# Patient Record
Sex: Male | Born: 1975 | Race: White | Hispanic: No | Marital: Married | Smoking: Current every day smoker
Health system: Southern US, Community
[De-identification: ages and names within clinical notes are randomized; demographics above are authoritative.]

## PROBLEM LIST (undated history)

## (undated) DIAGNOSIS — N200 Calculus of kidney: Secondary | ICD-10-CM

## (undated) DIAGNOSIS — F909 Attention-deficit hyperactivity disorder, unspecified type: Secondary | ICD-10-CM

## (undated) DIAGNOSIS — F32A Depression, unspecified: Secondary | ICD-10-CM

## (undated) DIAGNOSIS — F419 Anxiety disorder, unspecified: Secondary | ICD-10-CM

## (undated) DIAGNOSIS — K219 Gastro-esophageal reflux disease without esophagitis: Secondary | ICD-10-CM

## (undated) DIAGNOSIS — F329 Major depressive disorder, single episode, unspecified: Secondary | ICD-10-CM

## (undated) HISTORY — DX: Major depressive disorder, single episode, unspecified: F32.9

## (undated) HISTORY — PX: VASECTOMY: SHX75

## (undated) HISTORY — DX: Depression, unspecified: F32.A

## (undated) HISTORY — DX: Calculus of kidney: N20.0

## (undated) HISTORY — DX: Anxiety disorder, unspecified: F41.9

## (undated) HISTORY — DX: Attention-deficit hyperactivity disorder, unspecified type: F90.9

---

## 2003-01-11 ENCOUNTER — Emergency Department (HOSPITAL_COMMUNITY): Admission: EM | Admit: 2003-01-11 | Discharge: 2003-01-11 | Payer: Self-pay | Admitting: Emergency Medicine

## 2003-05-29 ENCOUNTER — Emergency Department (HOSPITAL_COMMUNITY): Admission: EM | Admit: 2003-05-29 | Discharge: 2003-05-30 | Payer: Self-pay | Admitting: Emergency Medicine

## 2003-05-30 ENCOUNTER — Encounter: Payer: Self-pay | Admitting: Emergency Medicine

## 2005-09-21 ENCOUNTER — Emergency Department (HOSPITAL_COMMUNITY): Admission: EM | Admit: 2005-09-21 | Discharge: 2005-09-21 | Payer: Self-pay | Admitting: Emergency Medicine

## 2006-03-10 ENCOUNTER — Emergency Department (HOSPITAL_COMMUNITY): Admission: EM | Admit: 2006-03-10 | Discharge: 2006-03-10 | Payer: Self-pay | Admitting: Emergency Medicine

## 2006-10-15 ENCOUNTER — Emergency Department (HOSPITAL_COMMUNITY): Admission: EM | Admit: 2006-10-15 | Discharge: 2006-10-16 | Payer: Self-pay | Admitting: Emergency Medicine

## 2008-08-03 ENCOUNTER — Ambulatory Visit: Payer: Self-pay | Admitting: Family Medicine

## 2008-08-03 DIAGNOSIS — Z87442 Personal history of urinary calculi: Secondary | ICD-10-CM | POA: Insufficient documentation

## 2008-08-03 DIAGNOSIS — F329 Major depressive disorder, single episode, unspecified: Secondary | ICD-10-CM

## 2008-08-07 LAB — CONVERTED CEMR LAB
ALT: 30 units/L (ref 0–53)
AST: 26 units/L (ref 0–37)
Basophils Absolute: 0 10*3/uL (ref 0.0–0.1)
Basophils Relative: 0.2 % (ref 0.0–3.0)
Bilirubin, Direct: 0.1 mg/dL (ref 0.0–0.3)
CO2: 30 meq/L (ref 19–32)
Creatinine, Ser: 0.8 mg/dL (ref 0.4–1.5)
GFR calc Af Amer: 146 mL/min
HDL: 31.3 mg/dL — ABNORMAL LOW (ref >39.0)
Lymphocytes Relative: 29 % (ref 12.0–46.0)
MCV: 95.7 fL (ref 78.0–100.0)
Monocytes Relative: 11.7 % (ref 3.0–12.0)
Neutro Abs: 4.5 10*3/uL (ref 1.4–7.7)
Neutrophils Relative %: 57.8 % (ref 43.0–77.0)
RDW: 13 % (ref 11.5–14.6)
Sodium: 140 meq/L (ref 135–145)
Total Bilirubin: 0.6 mg/dL (ref 0.3–1.2)
VLDL: 51 mg/dL — ABNORMAL HIGH (ref 0–40)

## 2008-08-08 ENCOUNTER — Telehealth: Payer: Self-pay | Admitting: Family Medicine

## 2008-08-14 ENCOUNTER — Telehealth: Payer: Self-pay | Admitting: Family Medicine

## 2008-08-24 ENCOUNTER — Ambulatory Visit: Payer: Self-pay | Admitting: Family Medicine

## 2008-08-30 ENCOUNTER — Encounter: Payer: Self-pay | Admitting: Family Medicine

## 2008-10-05 ENCOUNTER — Ambulatory Visit: Payer: Self-pay | Admitting: Family Medicine

## 2008-10-05 DIAGNOSIS — F411 Generalized anxiety disorder: Secondary | ICD-10-CM | POA: Insufficient documentation

## 2008-10-25 ENCOUNTER — Telehealth: Payer: Self-pay | Admitting: Family Medicine

## 2008-11-30 ENCOUNTER — Ambulatory Visit: Payer: Self-pay | Admitting: Family Medicine

## 2009-05-10 ENCOUNTER — Ambulatory Visit: Payer: Self-pay | Admitting: Family Medicine

## 2009-06-06 ENCOUNTER — Telehealth (INDEPENDENT_AMBULATORY_CARE_PROVIDER_SITE_OTHER): Payer: Self-pay | Admitting: *Deleted

## 2010-07-13 ENCOUNTER — Emergency Department (HOSPITAL_COMMUNITY): Admission: EM | Admit: 2010-07-13 | Discharge: 2010-07-13 | Payer: Self-pay | Admitting: Family Medicine

## 2010-11-26 LAB — POCT URINALYSIS DIPSTICK
Glucose, UA: NEGATIVE mg/dL
Ketones, ur: NEGATIVE mg/dL
Nitrite: NEGATIVE
Urobilinogen, UA: 0.2 mg/dL (ref 0.0–1.0)
pH: 5.5 (ref 5.0–8.0)

## 2010-12-11 ENCOUNTER — Encounter: Payer: Self-pay | Admitting: Family Medicine

## 2010-12-11 ENCOUNTER — Ambulatory Visit (INDEPENDENT_AMBULATORY_CARE_PROVIDER_SITE_OTHER): Payer: 59 | Admitting: Family Medicine

## 2010-12-11 VITALS — BP 110/70 | HR 108 | Temp 98.3°F

## 2010-12-11 DIAGNOSIS — L0390 Cellulitis, unspecified: Secondary | ICD-10-CM

## 2010-12-11 MED ORDER — DOXYCYCLINE HYCLATE 100 MG PO CAPS: 100.0000 mg | ORAL_CAPSULE | Freq: Two times a day (BID) | ORAL | Status: AC

## 2010-12-11 MED ORDER — CEFTRIAXONE SODIUM 1 G IJ SOLR
1.0000 g | Freq: Once | INTRAMUSCULAR | Status: AC
  Administered 2010-12-11: 1 g via INTRAMUSCULAR

## 2010-12-11 NOTE — Progress Notes (Signed)
  Subjective:    Patient ID: Jack Reid, male    DOB: 1976-03-19, 35 y.o.   MRN: [id number]  HPI Here for what he thinks is a spider bite on the back of the left had. He does not remember any trauma, but last night he began to have pain and redness and swelling in the hand. Today this has gotten worse. No fever or systemic complaints.    Review of Systems  Constitutional: Negative.   Respiratory: Negative.   Cardiovascular: Negative.   Skin: Positive for wound.       Objective:   Physical Exam  Constitutional: He appears well-developed and well-nourished.  Skin:       The dorsal and ulnar side of the left hand has 2 small pustules surrounded by a zone of erythema and swelling which is warm and quite tender. ROM of the fingers is full          Assessment & Plan:  These are probably spider bites that have gotten infected. Cover with Rocephin and Doxycycline. Use ice packs and Motrin.

## 2011-05-15 ENCOUNTER — Ambulatory Visit (INDEPENDENT_AMBULATORY_CARE_PROVIDER_SITE_OTHER): Payer: 59 | Admitting: Family Medicine

## 2011-05-15 ENCOUNTER — Encounter: Payer: Self-pay | Admitting: Family Medicine

## 2011-05-15 VITALS — BP 122/70 | HR 91 | Temp 98.5°F | Wt 149.0 lb

## 2011-05-15 DIAGNOSIS — M2140 Flat foot [pes planus] (acquired), unspecified foot: Secondary | ICD-10-CM

## 2011-05-15 MED ORDER — NABUMETONE 750 MG PO TABS: 750.0000 mg | ORAL_TABLET | Freq: Two times a day (BID) | ORAL | Status: DC

## 2011-05-15 NOTE — Progress Notes (Signed)
  Subjective:    Patient ID: Jack Reid, male    DOB: 12/27/1975, 35 y.o.   MRN: [id number]  HPI He has been flat fotted all his life, and now his feet cause him a lot of pain every day. His job entails walking on concrete all day, and he is required to wear steel toes boots. He has pain in the feet and even the lower legs. He has tried Motrin and Aleve, and he has tried gel arch supports, but these do not help. He knows molded arch supports are the best option, but his insurance will not cover them and he cannot afford them.    Review of Systems  Constitutional: Negative.        Objective:   Physical Exam  Constitutional: He appears well-developed and well-nourished.  Musculoskeletal:       Both arches are very flat. Otherwise feet look fine, no tenderness           Assessment & Plan:  Try Nabumetone to reduce inflammation.

## 2011-05-25 ENCOUNTER — Telehealth: Payer: Self-pay | Admitting: *Deleted

## 2011-05-25 DIAGNOSIS — M79673 Pain in unspecified foot: Secondary | ICD-10-CM

## 2011-05-25 NOTE — Telephone Encounter (Signed)
[facility] Triage Call Report Triage Record Num: [id number] Operator: Amy Head Patient Name: Jack Reid Call Date & Time: 05/22/2011 9:02:00PM Patient Phone: [phone] PCP: Tera Mater. Clent Ridges Patient Gender: Male PCP Fax : [phone] Patient DOB: 03-26-1976 [facility] Name[facility] Reason for Call: [name] calling and states that he was given Naeumetone 750MG  with instructions to take 1 tab PO BID for foot pain (pt states that he is extremely flat footed and was given this medication for foot pain until he can afford custom orthotics reccomended by MD). States that he has been taking 2 tabs at a time and wants to know if this is ok. Advised pt to take RX as directed by MD and not to increase dosage without MD approval. Protocol(s) Used: Medication Question Calls, No Triage (Adults) Recommended Outcome per Protocol: Call Provider within 24 Hours Reason for Outcome: Caller has non urgent medication question about med that PCP prescribed and triager unable to answer question Care Advice: ~ 05/22/2011 9:09:07PM Page 1 of 1 [id number]

## 2011-05-25 NOTE — Telephone Encounter (Signed)
Pt is asking to take a higher dose of Nabumetone for foot pain.

## 2011-05-26 NOTE — Telephone Encounter (Signed)
He is already on the highest dose of Nabumetone that there is. I cannot start him on narcotics for such a long term problem. As I told him during our visit, he needs to see a Podiatrist to find a long term solution for his foot pain

## 2011-05-26 NOTE — Telephone Encounter (Signed)
Spoke with pt and he does want a referral for the Podiatrist.

## 2011-05-26 NOTE — Telephone Encounter (Signed)
Pt callback concerning status of nabumetone for foot pain. Pt is aware wait on doc

## 2011-05-27 NOTE — Telephone Encounter (Signed)
Left voice message with below info. 

## 2011-05-27 NOTE — Telephone Encounter (Signed)
Referral was done. Let him know please

## 2011-07-31 ENCOUNTER — Telehealth: Payer: Self-pay | Admitting: Family Medicine

## 2011-07-31 NOTE — Telephone Encounter (Signed)
Pt would like cpx before end of yr. Can I work pt in?

## 2011-07-31 NOTE — Telephone Encounter (Signed)
Okay sometime in December

## 2011-08-03 NOTE — Telephone Encounter (Signed)
Pt is sch for cpx on 08-28-2011 830am

## 2011-08-28 ENCOUNTER — Encounter: Payer: Self-pay | Admitting: Family Medicine

## 2011-08-28 ENCOUNTER — Ambulatory Visit (INDEPENDENT_AMBULATORY_CARE_PROVIDER_SITE_OTHER): Payer: 59 | Admitting: Family Medicine

## 2011-08-28 VITALS — BP 120/64 | HR 88 | Temp 98.4°F | Ht 69.5 in | Wt 146.0 lb

## 2011-08-28 DIAGNOSIS — Z0000 Encounter for general adult medical examination without abnormal findings: Secondary | ICD-10-CM

## 2011-08-28 LAB — HEPATIC FUNCTION PANEL
ALT: 12 U/L (ref 0–53)
Bilirubin, Direct: 0 mg/dL (ref 0.0–0.3)
Total Bilirubin: 0.4 mg/dL (ref 0.3–1.2)

## 2011-08-28 LAB — CBC WITH DIFFERENTIAL/PLATELET
Basophils Relative: 0.4 % (ref 0.0–3.0)
Eosinophils Relative: 0.5 % (ref 0.0–5.0)
HCT: 45.5 % (ref 39.0–52.0)
Lymphocytes Relative: 20.2 % (ref 12.0–46.0)
Lymphs Abs: 2.2 10*3/uL (ref 0.7–4.0)
Monocytes Absolute: 0.8 10*3/uL (ref 0.1–1.0)
Monocytes Relative: 7.5 % (ref 3.0–12.0)
Neutro Abs: 7.7 10*3/uL (ref 1.4–7.7)
Neutrophils Relative %: 71.4 % (ref 43.0–77.0)
WBC: 10.8 10*3/uL — ABNORMAL HIGH (ref 4.5–10.5)

## 2011-08-28 LAB — POCT URINALYSIS DIPSTICK
Bilirubin, UA: NEGATIVE
Blood, UA: NEGATIVE
Leukocytes, UA: NEGATIVE
Nitrite, UA: NEGATIVE
Protein, UA: NEGATIVE
Urobilinogen, UA: 0.2
pH, UA: 7

## 2011-08-28 LAB — LIPID PANEL
Triglycerides: 172 mg/dL — ABNORMAL HIGH (ref 0.0–149.0)
VLDL: 34.4 mg/dL (ref 0.0–40.0)

## 2011-08-28 LAB — BASIC METABOLIC PANEL
Chloride: 107 mEq/L (ref 96–112)
Creatinine, Ser: 0.9 mg/dL (ref 0.4–1.5)
Glucose, Bld: 104 mg/dL — ABNORMAL HIGH (ref 70–99)
Potassium: 4.1 mEq/L (ref 3.5–5.1)

## 2011-08-28 LAB — LDL CHOLESTEROL, DIRECT: Direct LDL: 149.7 mg/dL

## 2011-08-28 NOTE — Progress Notes (Signed)
  Subjective:    Patient ID: Jack Reid, male    DOB: 1976/04/18, 35 y.o.   MRN: [id number]  HPI 35 yr old male for a cpx. He feels fine and has no concerns. His last kidney stone was passed about 6 months ago.   Review of Systems  Constitutional: Negative.   HENT: Negative.   Eyes: Negative.   Respiratory: Negative.   Cardiovascular: Negative.   Gastrointestinal: Negative.   Genitourinary: Negative.   Musculoskeletal: Negative.   Skin: Negative.   Neurological: Negative.   Hematological: Negative.   Psychiatric/Behavioral: Negative.        Objective:   Physical Exam  Constitutional: He is oriented to person, place, and time. He appears well-developed and well-nourished. No distress.  HENT:  Head: Normocephalic and atraumatic.  Right Ear: External ear normal.  Left Ear: External ear normal.  Nose: Nose normal.  Mouth/Throat: Oropharynx is clear and moist. No oropharyngeal exudate.  Eyes: Conjunctivae and EOM are normal. Pupils are equal, round, and reactive to light. Right eye exhibits no discharge. Left eye exhibits no discharge. No scleral icterus.  Neck: Neck supple. No JVD present. No tracheal deviation present. No thyromegaly present.  Cardiovascular: Normal rate, regular rhythm, normal heart sounds and intact distal pulses.  Exam reveals no gallop and no friction rub.   No murmur heard. Pulmonary/Chest: Effort normal and breath sounds normal. No respiratory distress. He has no wheezes. He has no rales. He exhibits no tenderness.  Abdominal: Soft. Bowel sounds are normal. He exhibits no distension and no mass. There is no tenderness. There is no rebound and no guarding.  Genitourinary: Rectum normal, prostate normal and penis normal. Guaiac negative stool. No penile tenderness.  Musculoskeletal: Normal range of motion. He exhibits no edema and no tenderness.  Lymphadenopathy:    He has no cervical adenopathy.  Neurological: He is alert and oriented to person, place,  and time. He has normal reflexes. No cranial nerve deficit. He exhibits normal muscle tone. Coordination normal.  Skin: Skin is warm and dry. No rash noted. He is not diaphoretic. No erythema. No pallor.  Psychiatric: He has a normal mood and affect. His behavior is normal. Judgment and thought content normal.          Assessment & Plan:  Well exam. Get labs today

## 2011-08-31 ENCOUNTER — Encounter: Payer: Self-pay | Admitting: Family Medicine

## 2011-08-31 NOTE — Progress Notes (Signed)
Quick Note:  Spoke with pt and put a copy in mail. ______ 

## 2011-10-24 ENCOUNTER — Emergency Department (HOSPITAL_COMMUNITY)
Admission: EM | Admit: 2011-10-24 | Discharge: 2011-10-24 | Disposition: A | Discharge status: Home / Self Care | Payer: No Typology Code available for payment source | Attending: Emergency Medicine | Admitting: Emergency Medicine

## 2011-10-24 ENCOUNTER — Emergency Department (HOSPITAL_COMMUNITY): Payer: No Typology Code available for payment source

## 2011-10-24 DIAGNOSIS — F329 Major depressive disorder, single episode, unspecified: Secondary | ICD-10-CM | POA: Insufficient documentation

## 2011-10-24 DIAGNOSIS — Y92410 Unspecified street and highway as the place of occurrence of the external cause: Secondary | ICD-10-CM | POA: Insufficient documentation

## 2011-10-24 DIAGNOSIS — F17200 Nicotine dependence, unspecified, uncomplicated: Secondary | ICD-10-CM | POA: Insufficient documentation

## 2011-10-24 DIAGNOSIS — S81012A Laceration without foreign body, left knee, initial encounter: Secondary | ICD-10-CM

## 2011-10-24 DIAGNOSIS — S81009A Unspecified open wound, unspecified knee, initial encounter: Secondary | ICD-10-CM | POA: Insufficient documentation

## 2011-10-24 DIAGNOSIS — Z79899 Other long term (current) drug therapy: Secondary | ICD-10-CM | POA: Insufficient documentation

## 2011-10-24 DIAGNOSIS — IMO0002 Reserved for concepts with insufficient information to code with codable children: Secondary | ICD-10-CM | POA: Insufficient documentation

## 2011-10-24 DIAGNOSIS — F3289 Other specified depressive episodes: Secondary | ICD-10-CM | POA: Insufficient documentation

## 2011-10-24 DIAGNOSIS — S00209A Unspecified superficial injury of unspecified eyelid and periocular area, initial encounter: Secondary | ICD-10-CM | POA: Insufficient documentation

## 2011-10-24 DIAGNOSIS — S3991XA Unspecified injury of abdomen, initial encounter: Secondary | ICD-10-CM

## 2011-10-24 DIAGNOSIS — Z87442 Personal history of urinary calculi: Secondary | ICD-10-CM | POA: Insufficient documentation

## 2011-10-24 DIAGNOSIS — S81011A Laceration without foreign body, right knee, initial encounter: Secondary | ICD-10-CM

## 2011-10-24 DIAGNOSIS — F411 Generalized anxiety disorder: Secondary | ICD-10-CM | POA: Insufficient documentation

## 2011-10-24 DIAGNOSIS — R109 Unspecified abdominal pain: Secondary | ICD-10-CM | POA: Insufficient documentation

## 2011-10-24 DIAGNOSIS — S1091XA Abrasion of unspecified part of neck, initial encounter: Secondary | ICD-10-CM

## 2011-10-24 LAB — PROTIME-INR
INR: 0.82 (ref 0.00–1.49)
Prothrombin Time: 11.5 seconds — ABNORMAL LOW (ref 11.6–15.2)

## 2011-10-24 LAB — POCT I-STAT, CHEM 8
BUN: 21 mg/dL (ref 6–23)
Calcium, Ion: 1.17 mmol/L (ref 1.12–1.32)
Chloride: 103 mEq/L (ref 96–112)
Creatinine, Ser: 1.1 mg/dL (ref 0.50–1.35)
Glucose, Bld: 155 mg/dL — ABNORMAL HIGH (ref 70–99)
TCO2: 28 mmol/L (ref 0–100)

## 2011-10-24 LAB — CBC
MCHC: 34.3 g/dL (ref 30.0–36.0)
Platelets: 258 10*3/uL (ref 150–400)
RBC: 4.53 MIL/uL (ref 4.22–5.81)
RDW: 13.1 % (ref 11.5–15.5)

## 2011-10-24 LAB — COMPREHENSIVE METABOLIC PANEL
AST: 48 U/L — ABNORMAL HIGH (ref 0–37)
Albumin: 3.9 g/dL (ref 3.5–5.2)
BUN: 20 mg/dL (ref 6–23)
Creatinine, Ser: 1.15 mg/dL (ref 0.50–1.35)
Glucose, Bld: 156 mg/dL — ABNORMAL HIGH (ref 70–99)
Potassium: 4.2 mEq/L (ref 3.5–5.1)
Sodium: 139 mEq/L (ref 135–145)
Total Bilirubin: 0.2 mg/dL — ABNORMAL LOW (ref 0.3–1.2)
Total Protein: 6.9 g/dL (ref 6.0–8.3)

## 2011-10-24 LAB — ETHANOL: Alcohol, Ethyl (B): <11 mg/dL (ref 0–11)

## 2011-10-24 MED ORDER — HYDROCODONE-ACETAMINOPHEN 5-325 MG PO TABS
2.0000 | ORAL_TABLET | Freq: Once | ORAL | Status: AC
  Administered 2011-10-24: 2 via ORAL
  Filled 2011-10-24: qty 2

## 2011-10-24 MED ORDER — BACITRACIN 500 UNIT/GM EX OINT
1.0000 "application " | TOPICAL_OINTMENT | Freq: Once | CUTANEOUS | Status: DC
  Filled 2011-10-24: qty 0.9

## 2011-10-24 MED ORDER — IBUPROFEN 800 MG PO TABS
800.0000 mg | ORAL_TABLET | Freq: Once | ORAL | Status: AC
  Administered 2011-10-24: 800 mg via ORAL
  Filled 2011-10-24: qty 1

## 2011-10-24 MED ORDER — FENTANYL CITRATE 0.05 MG/ML IJ SOLN
100.0000 ug | Freq: Once | INTRAMUSCULAR | Status: AC
  Administered 2011-10-24: 100 ug via INTRAVENOUS

## 2011-10-24 MED ORDER — CEPHALEXIN 500 MG PO CAPS: 500.0000 mg | ORAL_CAPSULE | Freq: Four times a day (QID) | ORAL | Status: DC

## 2011-10-24 MED ORDER — IOHEXOL 300 MG/ML  SOLN
100.0000 mL | Freq: Once | INTRAMUSCULAR | Status: AC | PRN
  Administered 2011-10-24: 100 mL via INTRAVENOUS

## 2011-10-24 MED ORDER — HYDROCODONE-ACETAMINOPHEN 5-325 MG PO TABS: 1.0000 | ORAL_TABLET | Freq: Four times a day (QID) | ORAL | Status: DC | PRN

## 2011-10-24 MED ORDER — TETANUS-DIPHTH-ACELL PERTUSSIS 5-2.5-18.5 LF-MCG/0.5 IM SUSP
0.5000 mL | Freq: Once | INTRAMUSCULAR | Status: AC
  Administered 2011-10-24: 0.5 mL via INTRAMUSCULAR
  Filled 2011-10-24: qty 0.5

## 2011-10-24 MED ORDER — FENTANYL CITRATE 0.05 MG/ML IJ SOLN: 50.0000 ug | Freq: Once | INTRAMUSCULAR | Status: DC

## 2011-10-24 MED ORDER — ONDANSETRON HCL 4 MG/2ML IJ SOLN
4.0000 mg | Freq: Once | INTRAMUSCULAR | Status: AC
  Administered 2011-10-24: 4 mg via INTRAVENOUS

## 2011-10-24 MED ORDER — SODIUM CHLORIDE 0.9 % IV SOLN: Freq: Once | INTRAVENOUS | Status: DC

## 2011-10-24 MED ORDER — FENTANYL CITRATE 0.05 MG/ML IJ SOLN
INTRAMUSCULAR | Status: AC | PRN
  Administered 2011-10-24 (×2): 50 ug via INTRAVENOUS

## 2011-10-24 MED ORDER — CEFAZOLIN SODIUM 1-5 GM-% IV SOLN
1.0000 g | Freq: Once | INTRAVENOUS | Status: AC
  Administered 2011-10-24: 1 g via INTRAVENOUS
  Filled 2011-10-24: qty 50

## 2011-10-24 MED ORDER — FENTANYL CITRATE 0.05 MG/ML IJ SOLN
50.0000 ug | Freq: Once | INTRAMUSCULAR | Status: DC
  Filled 2011-10-24: qty 2

## 2011-10-24 NOTE — ED Notes (Signed)
Back from CT, into room 2 no changes.

## 2011-10-24 NOTE — ED Notes (Signed)
Prefers to lie flat, c-collar removed per Dr. Read Drivers, pillow given, IV patent, infusing KVO, "feels a little better, pain med did not help much", resting/sleeping, arousable to voice, no changes, VSS.

## 2011-10-24 NOTE — ED Notes (Addendum)
Tolerated PO fluids with pain med, given snack & juice.

## 2011-10-24 NOTE — ED Notes (Signed)
Pharm tech at BS ?
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
## 2011-10-24 NOTE — ED Notes (Signed)
Denies dizziness, steady gait, VSS, asymptomatic during orthostatic VS.

## 2011-10-24 NOTE — ED Notes (Signed)
0143 fentanyl order is the previous verbal order from Dr. Read Drivers to repeat 1st dose (already given). Total at this time is given.

## 2011-10-24 NOTE — ED Notes (Signed)
Pt in CT, no changes, calm, NAD, alert, interactive. Pt to o go back to room 2 stretchers switched. Pt belongings in room 2.

## 2011-10-24 NOTE — ED Notes (Signed)
Mother called for ride, on her way.

## 2011-10-26 ENCOUNTER — Encounter: Payer: Self-pay | Admitting: Family Medicine

## 2011-10-26 ENCOUNTER — Ambulatory Visit (INDEPENDENT_AMBULATORY_CARE_PROVIDER_SITE_OTHER): Payer: 59 | Admitting: Family Medicine

## 2011-10-26 VITALS — BP 118/60 | HR 88 | Temp 99.1°F | Wt 150.0 lb

## 2011-10-26 DIAGNOSIS — S139XXA Sprain of joints and ligaments of unspecified parts of neck, initial encounter: Secondary | ICD-10-CM

## 2011-10-26 DIAGNOSIS — S161XXA Strain of muscle, fascia and tendon at neck level, initial encounter: Secondary | ICD-10-CM

## 2011-10-26 DIAGNOSIS — S81019A Laceration without foreign body, unspecified knee, initial encounter: Secondary | ICD-10-CM

## 2011-10-26 DIAGNOSIS — R109 Unspecified abdominal pain: Secondary | ICD-10-CM

## 2011-10-26 DIAGNOSIS — S81009A Unspecified open wound, unspecified knee, initial encounter: Secondary | ICD-10-CM

## 2011-10-26 DIAGNOSIS — S91009A Unspecified open wound, unspecified ankle, initial encounter: Secondary | ICD-10-CM

## 2011-10-26 MED ORDER — OXYCODONE-ACETAMINOPHEN 10-325 MG PO TABS: 1.0000 | ORAL_TABLET | Freq: Four times a day (QID) | ORAL | Status: DC | PRN
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
## 2011-10-28 DIAGNOSIS — Z0279 Encounter for issue of other medical certificate: Secondary | ICD-10-CM

## 2011-10-30 ENCOUNTER — Ambulatory Visit (INDEPENDENT_AMBULATORY_CARE_PROVIDER_SITE_OTHER): Payer: 59 | Admitting: Family Medicine

## 2011-10-30 ENCOUNTER — Encounter: Payer: Self-pay | Admitting: Family Medicine

## 2011-10-30 VITALS — BP 114/66 | HR 104 | Temp 99.0°F | Wt 150.0 lb

## 2011-10-30 DIAGNOSIS — S81019A Laceration without foreign body, unspecified knee, initial encounter: Secondary | ICD-10-CM

## 2011-10-30 DIAGNOSIS — S81809A Unspecified open wound, unspecified lower leg, initial encounter: Secondary | ICD-10-CM

## 2011-10-30 DIAGNOSIS — R109 Unspecified abdominal pain: Secondary | ICD-10-CM

## 2011-10-30 DIAGNOSIS — S139XXA Sprain of joints and ligaments of unspecified parts of neck, initial encounter: Secondary | ICD-10-CM

## 2011-10-30 DIAGNOSIS — S161XXA Strain of muscle, fascia and tendon at neck level, initial encounter: Secondary | ICD-10-CM

## 2011-11-02 ENCOUNTER — Encounter: Payer: Self-pay | Admitting: Family Medicine
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
## 2011-11-06 ENCOUNTER — Ambulatory Visit: Payer: 59 | Admitting: Family Medicine

## 2011-11-06 ENCOUNTER — Encounter: Payer: Self-pay | Admitting: Family Medicine

## 2011-11-06 ENCOUNTER — Ambulatory Visit (INDEPENDENT_AMBULATORY_CARE_PROVIDER_SITE_OTHER): Payer: 59 | Admitting: Family Medicine

## 2011-11-06 VITALS — BP 114/76 | HR 78 | Temp 98.4°F | Wt 147.0 lb

## 2011-11-06 DIAGNOSIS — S81019A Laceration without foreign body, unspecified knee, initial encounter: Secondary | ICD-10-CM

## 2011-11-06 DIAGNOSIS — R109 Unspecified abdominal pain: Secondary | ICD-10-CM

## 2011-11-06 DIAGNOSIS — S91009A Unspecified open wound, unspecified ankle, initial encounter: Secondary | ICD-10-CM
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
## 2011-11-09 ENCOUNTER — Ambulatory Visit: Payer: 59 | Admitting: Family Medicine

## 2011-11-09 DIAGNOSIS — Z0289 Encounter for other administrative examinations: Secondary | ICD-10-CM

## 2011-11-23 ENCOUNTER — Other Ambulatory Visit: Payer: Self-pay

## 2011-11-23 ENCOUNTER — Emergency Department (HOSPITAL_COMMUNITY): Payer: 59

## 2011-11-23 ENCOUNTER — Encounter (HOSPITAL_COMMUNITY): Payer: Self-pay | Admitting: Emergency Medicine

## 2011-11-23 ENCOUNTER — Telehealth: Payer: Self-pay | Admitting: Family Medicine

## 2011-11-23 ENCOUNTER — Emergency Department (HOSPITAL_COMMUNITY)
Admission: EM | Admit: 2011-11-23 | Discharge: 2011-11-23 | Disposition: A | Discharge status: Home / Self Care | Payer: 59 | Attending: Emergency Medicine | Admitting: Emergency Medicine

## 2011-11-23 DIAGNOSIS — R079 Chest pain, unspecified: Secondary | ICD-10-CM | POA: Insufficient documentation

## 2011-11-23 DIAGNOSIS — IMO0001 Reserved for inherently not codable concepts without codable children: Secondary | ICD-10-CM | POA: Insufficient documentation

## 2011-11-23 DIAGNOSIS — Z9852 Vasectomy status: Secondary | ICD-10-CM | POA: Insufficient documentation

## 2011-11-23 DIAGNOSIS — Z87442 Personal history of urinary calculi: Secondary | ICD-10-CM | POA: Insufficient documentation

## 2011-11-23 DIAGNOSIS — F319 Bipolar disorder, unspecified: Secondary | ICD-10-CM | POA: Insufficient documentation

## 2011-11-23 DIAGNOSIS — R110 Nausea: Secondary | ICD-10-CM | POA: Insufficient documentation

## 2011-11-23 DIAGNOSIS — R55 Syncope and collapse: Secondary | ICD-10-CM | POA: Insufficient documentation

## 2011-11-23 DIAGNOSIS — F411 Generalized anxiety disorder: Secondary | ICD-10-CM | POA: Insufficient documentation

## 2011-11-23 DIAGNOSIS — F17200 Nicotine dependence, unspecified, uncomplicated: Secondary | ICD-10-CM | POA: Insufficient documentation

## 2011-11-23 DIAGNOSIS — S0990XA Unspecified injury of head, initial encounter: Secondary | ICD-10-CM

## 2011-11-23 DIAGNOSIS — R51 Headache: Secondary | ICD-10-CM | POA: Insufficient documentation

## 2011-11-23 LAB — CBC
HCT: 38.2 % — ABNORMAL LOW (ref 39.0–52.0)
MCH: 32.3 pg (ref 26.0–34.0)
MCV: 94.1 fL (ref 78.0–100.0)
Platelets: 202 10*3/uL (ref 150–400)
RDW: 13 % (ref 11.5–15.5)

## 2011-11-23 LAB — BASIC METABOLIC PANEL
CO2: 25 mEq/L (ref 19–32)
Calcium: 9.2 mg/dL (ref 8.4–10.5)
Creatinine, Ser: 0.76 mg/dL (ref 0.50–1.35)
GFR calc non Af Amer: >90 mL/min (ref >90)
Glucose, Bld: 85 mg/dL (ref 70–99)
Sodium: 139 mEq/L (ref 135–145)

## 2011-11-23 LAB — DIFFERENTIAL
Basophils Absolute: 0 10*3/uL (ref 0.0–0.1)
Eosinophils Absolute: 0.1 10*3/uL (ref 0.0–0.7)
Eosinophils Relative: 1 % (ref 0–5)
Lymphocytes Relative: 31 % (ref 12–46)
Monocytes Absolute: 0.7 10*3/uL (ref 0.1–1.0)

## 2011-11-23 LAB — GLUCOSE, CAPILLARY

## 2011-11-23 MED ORDER — SODIUM CHLORIDE 0.9 % IV BOLUS (SEPSIS)
1000.0000 mL | Freq: Once | INTRAVENOUS | Status: AC
  Administered 2011-11-23: 1000 mL via INTRAVENOUS

## 2011-11-23 NOTE — ED Notes (Signed)
Pt is very irritated and upset about the wait as is his family member with him.  Attempted to explain and apologize
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
## 2011-11-23 NOTE — ED Notes (Signed)
Pt sitting with mother in waiting room. Alert and interactive. Appears in nad.

## 2011-11-23 NOTE — ED Notes (Signed)
CBG 101 

## 2011-11-23 NOTE — Telephone Encounter (Signed)
Pt is aware of Dr Clent Ridges response. Pt is ok to ER visit today.

## 2011-11-23 NOTE — Discharge Instructions (Signed)
Concussion and Brain Injury A blow or jolt to the head can disrupt the normal function of the brain. This type of brain injury is often called a "concussion" or a "closed head injury." Concussions are usually not life-threatening. Even so, the effects of a concussion can be serious.  CAUSES  A concussion is caused by a blunt blow to the head. The blow might be direct or indirect as described below.  Direct blow (running into another player during a soccer game, being hit in a fight, or hitting your head on a hard surface).   Indirect blow (when your head moves rapidly and violently back and forth like in a car crash).  SYMPTOMS  The brain is very complex. Every head injury is different. Some symptoms may appear right away. Other symptoms may not show up for days or weeks after the concussion. The signs of concussion can be hard to notice. Early on, problems may be missed by patients, family members, and caregivers. You may look fine even though you are acting or feeling differently.  These symptoms are usually temporary, but may last for days, weeks, or even longer. Symptoms include:  Mild headaches that will not go away.   Having more trouble than usual with:   Remembering things.   Paying attention or concentrating.   Organizing daily tasks.   Making decisions and solving problems.   Slowness in thinking, acting, speaking, or reading.   Getting lost or easily confused.   Feeling tired all the time or lacking energy (fatigue).   Feeling drowsy.   Sleep disturbances.   Sleeping more than usual.   Sleeping less than usual.   Trouble falling asleep.   Trouble sleeping (insomnia).   Loss of balance or feeling lightheaded or dizzy.   Nausea or vomiting.   Numbness or tingling.   Increased sensitivity to:   Sounds.   Lights.   Distractions.  Other symptoms might include:  Vision problems or eyes that tire easily.   Diminished sense of taste or smell.   Ringing  in the ears.   Mood changes such as feeling sad, anxious, or listless.   Becoming easily irritated or angry for little or no reason.   Lack of motivation.  DIAGNOSIS  Your caregiver can usually diagnose a concussion or mild brain injury based on your description of your injury and your symptoms.  Your evaluation might include:  A brain scan to look for signs of injury to the brain. Even if the test shows no injury, you may still have a concussion.   Blood tests to be sure other problems are not present.  TREATMENT   People with a concussion need to be examined and evaluated. Most people with concussions are treated in an emergency department, urgent care, or clinic. Some people must stay in the hospital overnight for further treatment.   Your caregiver will send you home with important instructions to follow. Be sure to carefully follow them.   Tell your caregiver if you are already taking any medicines (prescription, over-the-counter, or natural remedies), or if you are drinking alcohol or taking illegal drugs. Also, talk with your caregiver if you are taking blood thinners (anticoagulants) or aspirin. These drugs may increase your chances of complications. All of this is important information that may affect treatment.   Only take over-the-counter or prescription medicines for pain, discomfort, or fever as directed by your caregiver.  PROGNOSIS  How fast people recover from brain injury varies from person to person.   Although most people have a good recovery, how quickly they improve depends on many factors. These factors include how severe their concussion was, what part of the brain was injured, their age, and how healthy they were before the concussion.  Because all head injuries are different, so is recovery. Most people with mild injuries recover fully. Recovery can take time. In general, recovery is slower in older persons. Also, persons who have had a concussion in the past or have  other medical problems may find that it takes longer to recover from their current injury. Anxiety and depression may also make it harder to adjust to the symptoms of brain injury. HOME CARE INSTRUCTIONS  Return to your normal activities slowly, not all at once. You must give your body and brain enough time for recovery.  Get plenty of sleep at night, and rest during the day. Rest helps the brain to heal.   Avoid staying up late at night.   Keep the same bedtime hours on weekends and weekdays.   Take daytime naps or rest breaks when you feel tired.   Limit activities that require a lot of thought or concentration (brain or cognitive rest). This includes:   Homework or job-related work.   Watching TV.   Computer work.   Avoid activities that could lead to a second brain injury, such as contact or recreational sports, until your caregiver says it is okay. Even after your brain injury has healed, you should protect yourself from having another concussion.   Ask your caregiver when you can return to your normal activities such as driving, bicycling, or operating heavy equipment. Your ability to react may be slower after a brain injury.   Talk with your caregiver about when you can return to work or school.   Inform your teachers, school nurse, school counselor, coach, Product/process development scientist, or work Freight forwarder about your injury, symptoms, and restrictions. They should be instructed to report:   Increased problems with attention or concentration.   Increased problems remembering or learning new information.   Increased time needed to complete tasks or assignments.   Increased irritability or decreased ability to cope with stress.   Increased symptoms.   Take only those medicines that your caregiver has approved.   Do not drink alcohol until your caregiver says you are well enough to do so. Alcohol and certain other drugs may slow your recovery and can put you at risk of further injury.    If it is harder than usual to remember things, write them down.   If you are easily distracted, try to do one thing at a time. For example, do not try to watch TV while fixing dinner.   Talk with family members or close friends when making important decisions.   Keep all follow-up appointments. Repeated evaluation of your symptoms is recommended for your recovery.  PREVENTION  Protect your head from future injury. It is very important to avoid another head or brain injury before you have recovered. In rare cases, another injury has lead to permanent brain damage, brain swelling, or death. Avoid injuries by using:  Seatbelts when riding in a car.   Alcohol only in moderation.   A helmet when biking, skiing, skateboarding, skating, or doing similar activities.   Safety measures in your home.   Remove clutter and tripping hazards from floors and stairways.   Use grab bars in bathrooms and handrails by stairs.   Place non-slip mats on floors and in bathtubs.  Improve lighting in dim areas.  SEEK MEDICAL CARE IF:  A head injury can cause lingering symptoms. You should seek medical care if you have any of the following symptoms for more than 3 weeks after your injury or are planning to return to sports:  Chronic headaches.   Dizziness or balance problems.   Nausea.   Vision problems.   Increased sensitivity to noise or light.   Depression or mood swings.   Anxiety or irritability.   Memory problems.   Difficulty concentrating or paying attention.   Sleep problems.   Feeling tired all the time.  SEEK IMMEDIATE MEDICAL CARE IF:  You have had a blow or jolt to the head and you (or your family or friends) notice:  Severe or worsening headaches.   Weakness (even if only in one hand or one leg or one part of the face), numbness, or decreased coordination.   Repeated vomiting.   Increased sleepiness or passing out.   One black center of the eye (pupil) is larger  than the other.   Convulsions (seizures).   Slurred speech.   Increasing confusion, restlessness, agitation, or irritability.   Lack of ability to recognize people or places.   Neck pain.   Difficulty being awakened.   Unusual behavior changes.   Loss of consciousness.  Older adults with a brain injury may have a higher risk of serious complications such as a blood clot on the brain. Headaches that get worse or an increase in confusion are signs of this complication. If these signs occur, see a caregiver right away. MAKE SURE YOU:   Understand these instructions.   Will watch your condition.   Will get help right away if you are not doing well or get worse.  FOR MORE INFORMATION  Several groups help people with brain injury and their families. They provide information and put people in touch with local resources. These include support groups, rehabilitation services, and a variety of health care professionals. Among these groups, the Brain Injury Association (BIA, www.biausa.org) has a Secretary/administrator that gathers scientific and educational information and works on a national level to help people with brain injury.  Document Released: 11/21/2003 Document Revised: 08/20/2011 Document Reviewed: 04/18/2008 [facility] Patient Information 2012 [place], [place].Head Injury, Adult You have had a head injury that does not appear serious at this time. A concussion is a state of changed mental ability, usually from a blow to the head. You should take clear liquids for the rest of the day and then resume your regular diet. You should not take sedatives or alcoholic beverages for as long as directed by your caregiver after discharge. After injuries such as yours, most problems occur within the first 24 hours. SYMPTOMS These minor symptoms may be experienced after discharge:  Memory difficulties.   Dizziness.   Headaches.   Double vision.   Hearing difficulties.   Depression.    Tiredness.   Weakness.   Difficulty with concentration.  If you experience any of these problems, you should not be alarmed. A concussion requires a few days for recovery. Many patients with head injuries frequently experience such symptoms. Usually, these problems disappear without medical care. If symptoms last for more than one day, notify your caregiver. See your caregiver sooner if symptoms are becoming worse rather than better. HOME CARE INSTRUCTIONS   During the next 24 hours you must stay with someone who can watch you for the warning signs listed below.  Although it is unlikely that  serious side effects will occur, you should be aware of signs and symptoms which may necessitate your return to this location. Side effects may occur up to 7 - 10 days following the injury. It is important for you to carefully monitor your condition and contact your caregiver or seek immediate medical attention if there is a change in your condition. SEEK IMMEDIATE MEDICAL CARE IF:   There is confusion or drowsiness.   You can not awaken the injured person.   There is nausea (feeling sick to your stomach) or continued, forceful vomiting.   You notice dizziness or unsteadiness which is getting worse, or inability to walk.   You have convulsions or unconsciousness.   You experience severe, persistent headaches not relieved by over-the-counter or prescription medicines for pain. (Do not take aspirin as this impairs clotting abilities). Take other pain medications only as directed.   You can not use arms or legs normally.   There is clear or bloody discharge from the nose or ears.  MAKE SURE YOU:   Understand these instructions.   Will watch your condition.   Will get help right away if you are not doing well or get worse.  Document Released: 08/31/2005 Document Revised: 08/20/2011 Document Reviewed: 07/19/2009 [facility] Patient Information 2012 [place], [place].

## 2011-11-23 NOTE — ED Notes (Signed)
Dr Alto Denver into see patient and discuss discharge inst.  Patient and Mother had questioned answered.

## 2011-11-23 NOTE — Telephone Encounter (Signed)
Spoke with patient and he will go to the ER. 
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
## 2012-06-23 ENCOUNTER — Ambulatory Visit (INDEPENDENT_AMBULATORY_CARE_PROVIDER_SITE_OTHER): Payer: 59 | Admitting: Family Medicine

## 2012-06-23 ENCOUNTER — Encounter: Payer: Self-pay | Admitting: Family Medicine

## 2012-06-23 VITALS — BP 132/80 | HR 119 | Temp 99.1°F | Wt 166.0 lb

## 2012-06-23 DIAGNOSIS — R1032 Left lower quadrant pain: Secondary | ICD-10-CM

## 2012-06-23 LAB — POCT URINALYSIS DIPSTICK
Blood, UA: NEGATIVE
Glucose, UA: 500
Ketones, UA: NEGATIVE
Spec Grav, UA: 1.015

## 2012-06-23 MED ORDER — OXYCODONE-ACETAMINOPHEN 10-325 MG PO TABS: 1.0000 | ORAL_TABLET | Freq: Four times a day (QID) | ORAL | Status: AC | PRN

## 2012-06-23 NOTE — Addendum Note (Signed)
Addended by: Aniceto Boss A on: 06/23/2012 12:11 PM   Modules accepted: Orders

## 2012-06-23 NOTE — Progress Notes (Signed)
  Subjective:    Patient ID: Jack Reid, male    DOB: [birth year], 36 y.o.   MRN: [id number]  HPI Here for the sudden onset this am of sharp pains in the left flank which are now suprapubic. These come and go. He feels pressure to urinate but not much comes out. No visible blood in the urine. No fever or nausea. BMs are normal. He has passed 5 kidney stones in the last 5 years, and he says this is exactly how the others felt. The last stone he passed was 2 years ago.    Review of Systems  Constitutional: Negative.   Gastrointestinal: Negative for nausea, vomiting, diarrhea, constipation, blood in stool, abdominal distention and rectal pain.  Genitourinary: Positive for urgency and flank pain. Negative for dysuria, frequency, hematuria, decreased urine volume, discharge, enuresis, difficulty urinating and testicular pain.       Objective:   Physical Exam  Constitutional: He appears well-developed and well-nourished.  Abdominal: Soft. Bowel sounds are normal. He exhibits no distension and no mass. There is no rebound and no guarding.       Mildly tender in the LLQ           Assessment & Plan:  Probable kidney stone, doubt any UTI. Drink lots of water. Given some Percocet for pain. He will see me back if this is no better in a few days. If the pain gets worse, he will go to the ER.

## 2012-07-13 IMAGING — CT CT HEAD W/O CM
1 of 2 series · 13 of 30 positions shown, 17 images · non-contrast
Comparison: 10/24/2011

CLINICAL DATA: Near-syncope

CT HEAD WITHOUT CONTRAST
TECHNIQUE: Contiguous axial images were obtained from the base of
the skull through the vertex without contrast.

[Series 2: brain · axial · 0.46mm/px · z∈[+49,+184]mm · 13 of 32 slices shown, 17 images]
[im 3/32  brain]
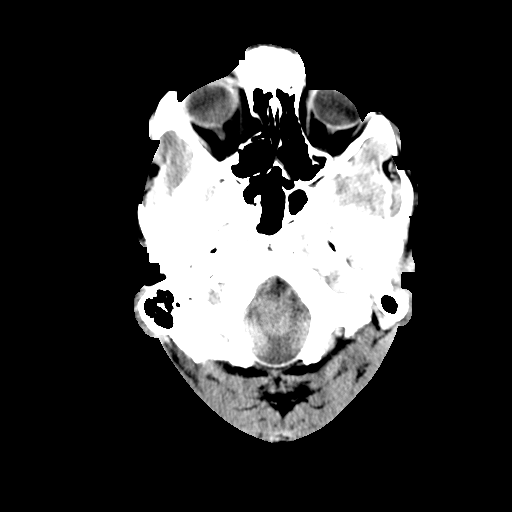
[im 3/32  bone]
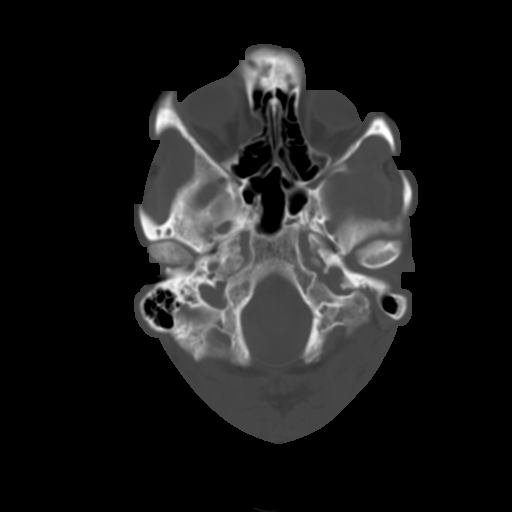
[im 5/32  brain]
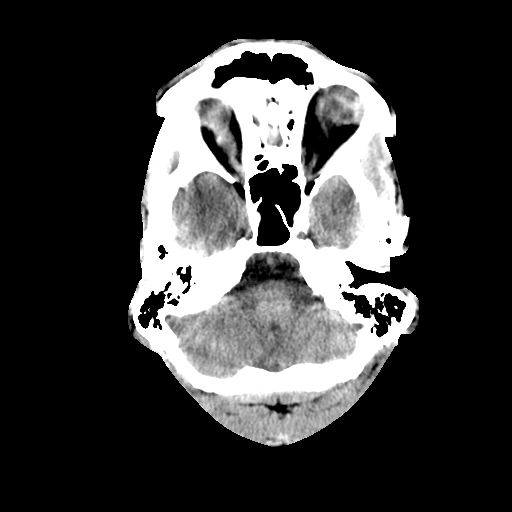
[im 7/32  brain]
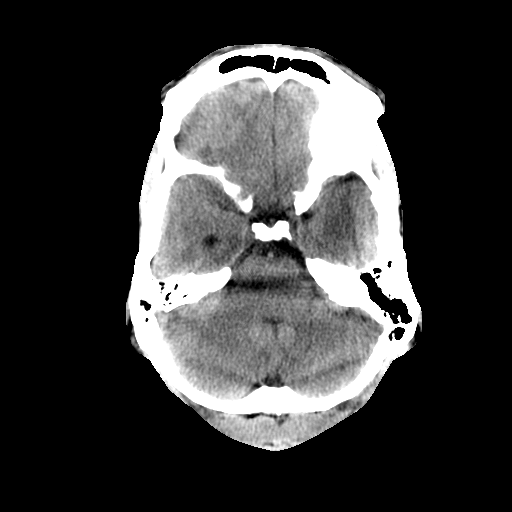
[im 9/32  brain]
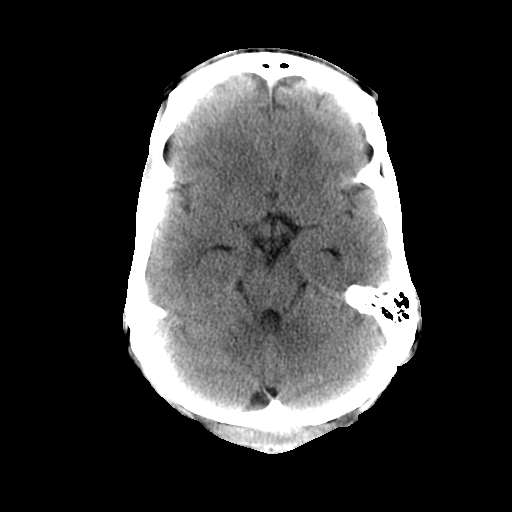
[im 12/32  brain]
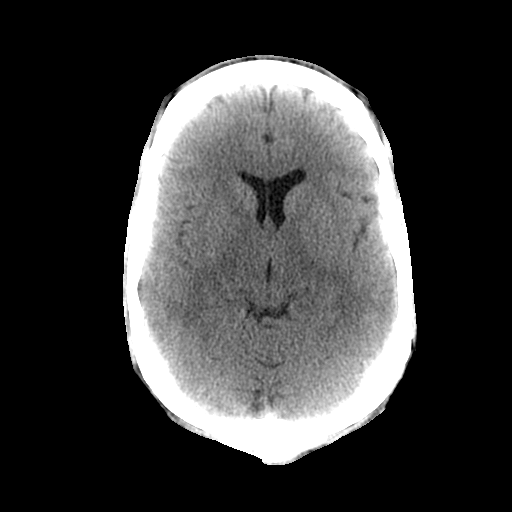
[im 12/32  bone]
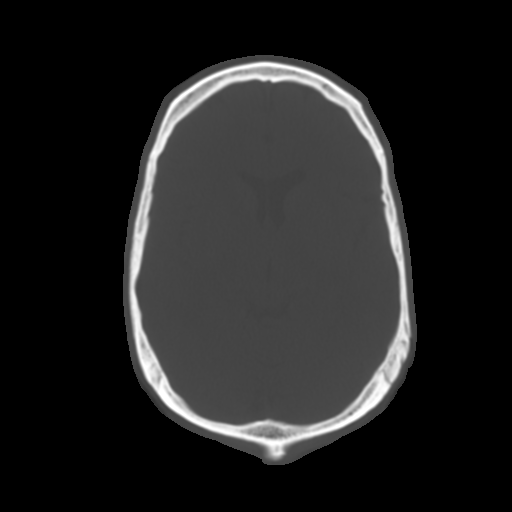
[im 14/32  brain]
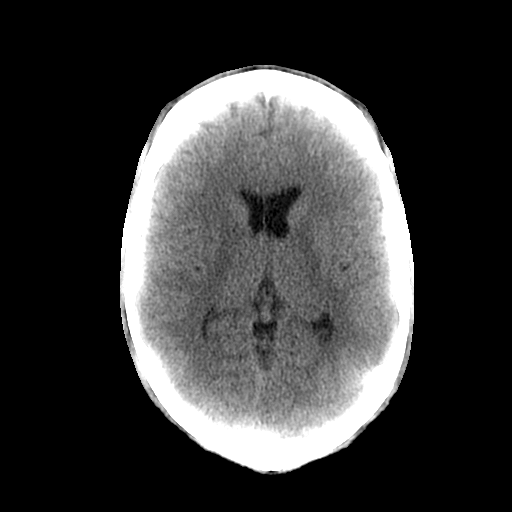
[im 16/32  brain]
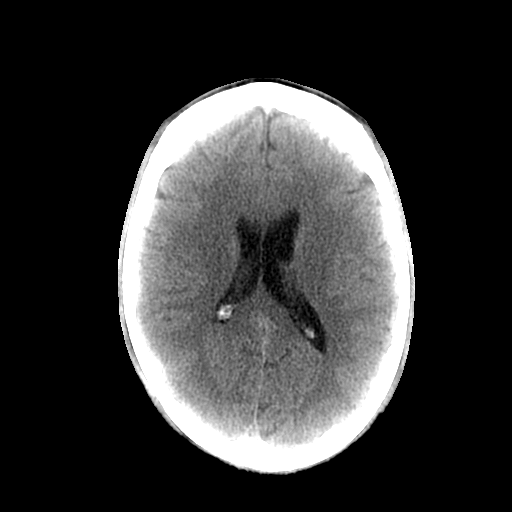
[im 18/32  brain]
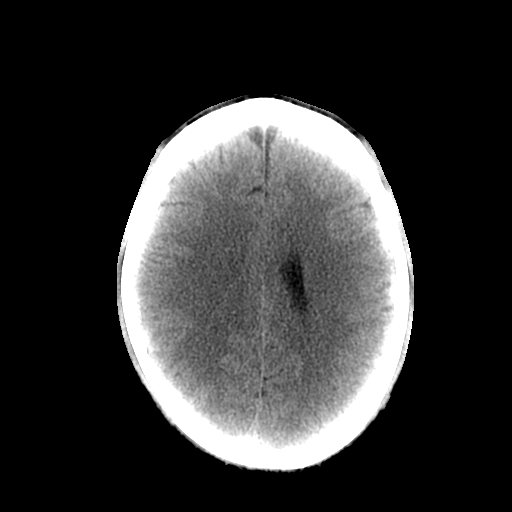
[im 20/32  brain]
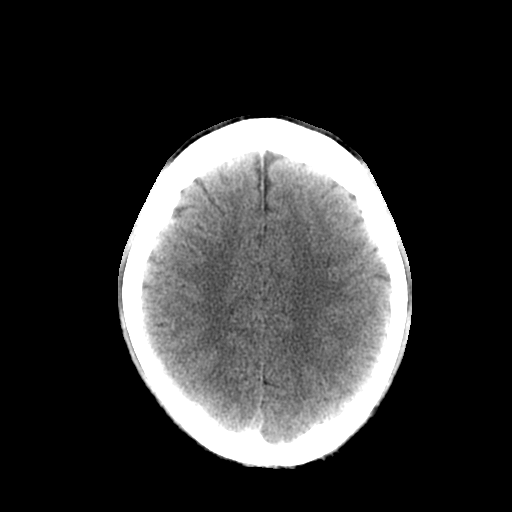
[im 20/32  bone]
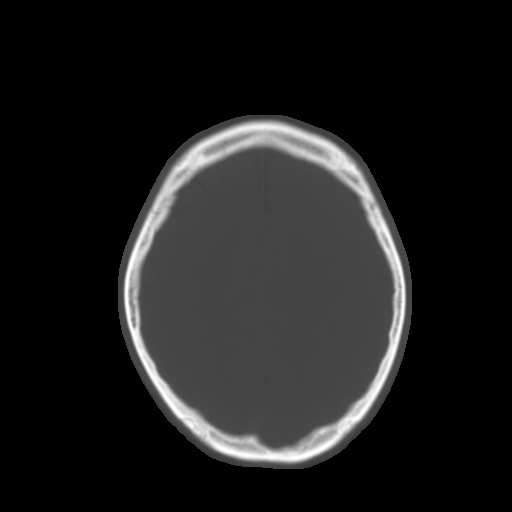
[im 23/32  brain]
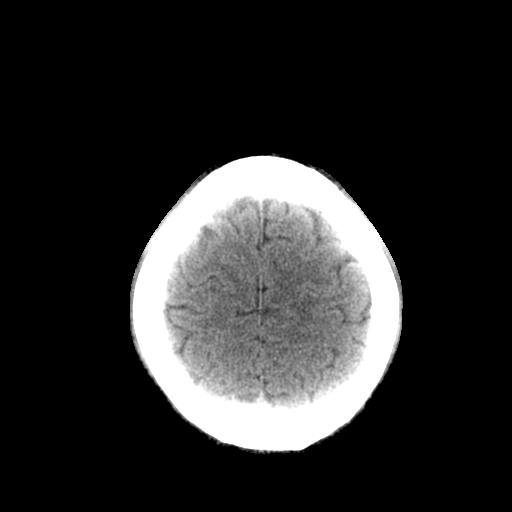
[im 25/32  brain]
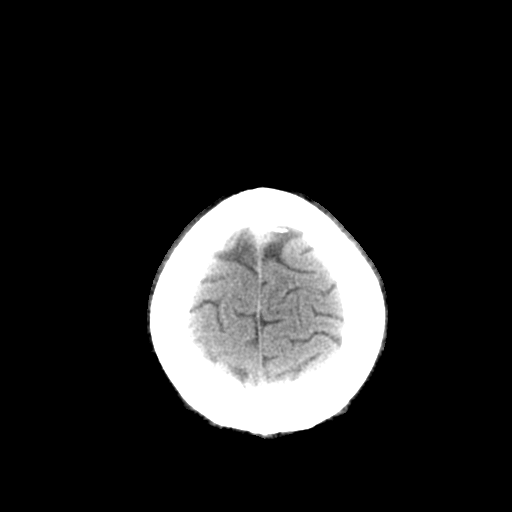
[im 27/32  brain]
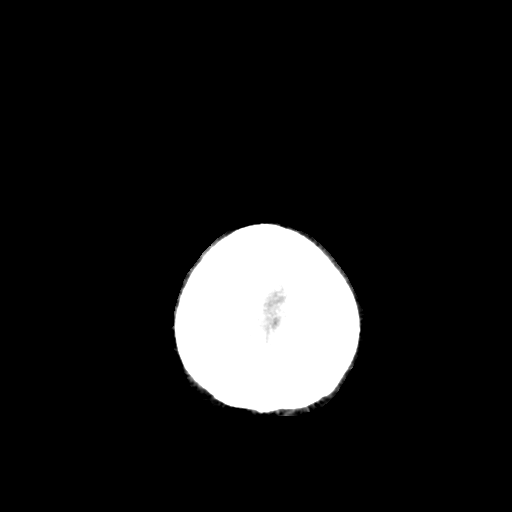
[im 29/32  brain]
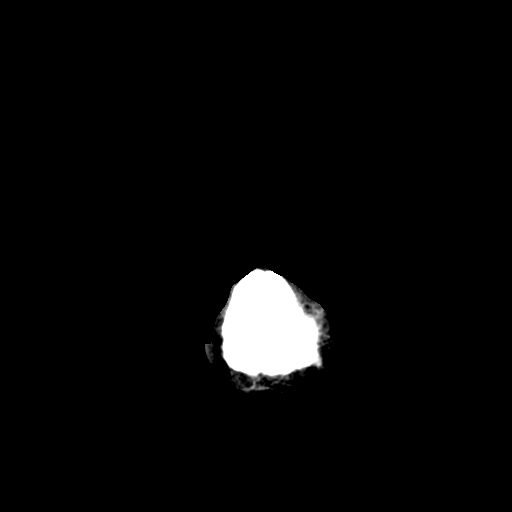
[im 29/32  bone]
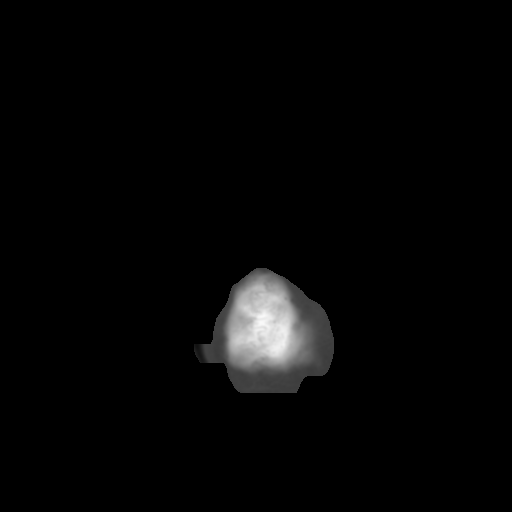

[13 of 30 positions shown; findings below may reference images not displayed]

FINDINGS: No skull fracture is noted.  There is mild mucosal
thickening with posterior air-fluid level right maxillary sinus
again noted.  The mastoid air cells are unremarkable.  No
intracranial hemorrhage, mass effect or midline shift.  No acute
infarction.  No hydrocephalus.  No mass lesion is noted on this
unenhanced scan. Again noted cortical defect with medial bulge of
the medial wall of the right orbit without change from prior exam.
IMPRESSION: No acute intracranial abnormality.  Again noted mild mucosal
thickening with air fluid level posterior aspect of the right
maxillary sinus.  Stable defect probable due to prior injury medial
wall of the right orbit

## 2012-09-02 ENCOUNTER — Ambulatory Visit (INDEPENDENT_AMBULATORY_CARE_PROVIDER_SITE_OTHER): Payer: 59 | Admitting: Family Medicine

## 2012-09-02 ENCOUNTER — Encounter: Payer: Self-pay | Admitting: Family Medicine

## 2012-09-02 VITALS — BP 124/80 | Temp 98.6°F

## 2012-09-02 DIAGNOSIS — H109 Unspecified conjunctivitis: Secondary | ICD-10-CM

## 2012-09-02 DIAGNOSIS — A499 Bacterial infection, unspecified: Secondary | ICD-10-CM

## 2012-09-02 DIAGNOSIS — H1089 Other conjunctivitis: Secondary | ICD-10-CM

## 2012-09-02 MED ORDER — TOBRAMYCIN 0.3 % OP SOLN: 1.0000 [drp] | OPHTHALMIC | Status: DC

## 2012-09-02 NOTE — Progress Notes (Signed)
  Subjective:    Patient ID: Jack Reid, male    DOB: 1976/06/30, 36 y.o.   MRN: [id number]  HPI  Patient seen with left eye irritation. Started about 4 days ago. No use of contacts. Works and Charity fundraiser with lots of dust. He was concerned about possible foreign body though no known exposure. He's had matting and some drainage past couple days. No blurred vision. Minimal if any pain. No right eye symptoms.   Review of Systems  Eyes: Positive for discharge, redness and itching. Negative for photophobia, pain and visual disturbance.  Neurological: Negative for headaches.       Objective:   Physical Exam  Constitutional: He appears well-developed and well-nourished.  Eyes: Pupils are equal, round, and reactive to light.       Left conjunctiva is erythematous. Pupils equal round reactive to light. Extraocular movements are normal. Fundi are benign. Fluoroscopy seen stain applied to left eye. No evidence for foreign body, corneal ulceration, or corneal abrasion          Assessment & Plan:  Left eye conjunctivitis. Suspect bacterial. No evidence for foreign body or abrasion. Warm compresses several times daily. TobraDex drops 2 drops left eye

## 2012-09-02 NOTE — Patient Instructions (Addendum)
Conjunctivitis Conjunctivitis is commonly called "pink eye." Conjunctivitis can be caused by bacterial or viral infection, allergies, or injuries. There is usually redness of the lining of the eye, itching, discomfort, and sometimes discharge. There may be deposits of matter along the eyelids. A viral infection usually causes a watery discharge, while a bacterial infection causes a yellowish, thick discharge. Pink eye is very contagious and spreads by direct contact. You may be given antibiotic eyedrops as part of your treatment. Before using your eye medicine, remove all drainage from the eye by washing gently with warm water and cotton balls. Continue to use the medication until you have awakened 2 mornings in a row without discharge from the eye. Do not rub your eye. This increases the irritation and helps spread infection. Use separate towels from other household members. Wash your hands with soap and water before and after touching your eyes. Use cold compresses to reduce pain and sunglasses to relieve irritation from light. Do not wear contact lenses or wear eye makeup until the infection is gone. SEEK MEDICAL CARE IF:   Your symptoms are not better after 3 days of treatment.  You have increased pain or trouble seeing.  The outer eyelids become very red or swollen. Document Released: 10/08/2004 Document Revised: 11/23/2011 Document Reviewed: 08/31/2005 [facility] Patient Information 2013 [place], [place].  Touch base by Monday if eye no better and sooner if any blurred vision or other eye problems.

## 2012-10-07 ENCOUNTER — Ambulatory Visit (INDEPENDENT_AMBULATORY_CARE_PROVIDER_SITE_OTHER): Payer: 59 | Admitting: Family Medicine

## 2012-10-07 ENCOUNTER — Encounter: Payer: Self-pay | Admitting: Family Medicine

## 2012-10-07 VITALS — BP 120/72 | HR 102 | Temp 98.6°F | Wt 166.0 lb

## 2012-10-07 DIAGNOSIS — R590 Localized enlarged lymph nodes: Secondary | ICD-10-CM

## 2012-10-07 DIAGNOSIS — R599 Enlarged lymph nodes, unspecified: Secondary | ICD-10-CM

## 2012-10-07 MED ORDER — TRIFLURIDINE 1 % OP SOLN: 1.0000 [drp] | OPHTHALMIC | Status: DC

## 2012-10-07 NOTE — Progress Notes (Signed)
  Subjective:    Patient ID: Jack Reid, male    DOB: 11-Nov-1975, 37 y.o.   MRN: [id number]  HPI Here to check some lumps in the left neck that came up about 3 weeks ago. They were never painful. They now seem to be going back down. No ST or ear problems. About 5 weeks ago he was seen here for left eye irritation and was treated with Tobradex for presumed conjunctivitis. When he did not improve he went to [facility] and was diagnosed with a "herpes infection". I cannot be sure from [name]'s description if they thought this was due to simplex or zoster. He never had any rashes or photophobia, just some eye pain. He was prescribed Trifluridine drops, and they are helping a lot. He will use these a few more days and then stop. The eye pain has resolved.    Review of Systems  Constitutional: Negative.   HENT: Negative.   Eyes: Negative.        Objective:   Physical Exam  Constitutional: He appears well-nourished.  HENT:  Right Ear: External ear normal.  Left Ear: External ear normal.  Nose: Nose normal.  Mouth/Throat: Oropharynx is clear and moist. No oropharyngeal exudate.  Eyes: Conjunctivae normal and EOM are normal. Pupils are equal, round, and reactive to light. Right eye exhibits no discharge. Left eye exhibits no discharge. No scleral icterus.  Neck: Neck supple.       Several enlarged but nontender lymph nodes along the left AC chain           Assessment & Plan:  This is reactive adenopathy from the herpetic infection in the eye. I reassured him this would be self limited and will resolve on its own. Finish out the eye drops.

## 2012-11-16 ENCOUNTER — Encounter: Payer: Self-pay | Admitting: Family Medicine

## 2012-11-16 ENCOUNTER — Ambulatory Visit (INDEPENDENT_AMBULATORY_CARE_PROVIDER_SITE_OTHER): Payer: 59 | Admitting: Family Medicine

## 2012-11-16 VITALS — BP 120/78 | HR 105 | Temp 98.4°F | Ht 69.5 in | Wt 160.0 lb

## 2012-11-16 DIAGNOSIS — Z0000 Encounter for general adult medical examination without abnormal findings: Secondary | ICD-10-CM

## 2012-11-16 LAB — POCT URINALYSIS DIPSTICK
Leukocytes, UA: NEGATIVE
Nitrite, UA: NEGATIVE
pH, UA: 5.5

## 2012-11-16 LAB — CBC WITH DIFFERENTIAL/PLATELET
Basophils Absolute: 0 10*3/uL (ref 0.0–0.1)
Eosinophils Absolute: 0 10*3/uL (ref 0.0–0.7)
Lymphocytes Relative: 16.9 % (ref 12.0–46.0)
MCHC: 34.1 g/dL (ref 30.0–36.0)
Neutro Abs: 7.9 10*3/uL — ABNORMAL HIGH (ref 1.4–7.7)
Neutrophils Relative %: 75.3 % (ref 43.0–77.0)
Platelets: 269 10*3/uL (ref 150.0–400.0)
RDW: 12.9 % (ref 11.5–14.6)

## 2012-11-16 LAB — HEPATIC FUNCTION PANEL
ALT: 15 U/L (ref 0–53)
AST: 13 U/L (ref 0–37)
Albumin: 4 g/dL (ref 3.5–5.2)

## 2012-11-16 LAB — LIPID PANEL
Cholesterol: 195 mg/dL (ref 0–200)
Triglycerides: 160 mg/dL — ABNORMAL HIGH (ref 0.0–149.0)
VLDL: 32 mg/dL (ref 0.0–40.0)

## 2012-11-16 LAB — BASIC METABOLIC PANEL
BUN: 17 mg/dL (ref 6–23)
Chloride: 103 mEq/L (ref 96–112)
Creatinine, Ser: 1 mg/dL (ref 0.4–1.5)
GFR: 94.94 mL/min (ref >60.00)
Glucose, Bld: 112 mg/dL — ABNORMAL HIGH (ref 70–99)
Potassium: 3.9 mEq/L (ref 3.5–5.1)

## 2012-11-16 NOTE — Progress Notes (Signed)
  Subjective:    Patient ID: Jack Reid, male    DOB: 28-Jul-1976, 37 y.o.   MRN: [id number]  HPI 37 yr old male for a cpx. He feels fine and has no concerns. He still sees Dr. Evelene Croon frequently.    Review of Systems  Constitutional: Negative.   HENT: Negative.   Eyes: Negative.   Respiratory: Negative.   Cardiovascular: Negative.   Gastrointestinal: Negative.   Genitourinary: Negative.   Musculoskeletal: Negative.   Skin: Negative.   Neurological: Negative.   Psychiatric/Behavioral: Negative.        Objective:   Physical Exam  Constitutional: He is oriented to person, place, and time. He appears well-developed and well-nourished. No distress.  HENT:  Head: Normocephalic and atraumatic.  Right Ear: External ear normal.  Left Ear: External ear normal.  Nose: Nose normal.  Mouth/Throat: Oropharynx is clear and moist. No oropharyngeal exudate.  Eyes: Conjunctivae and EOM are normal. Pupils are equal, round, and reactive to light. Right eye exhibits no discharge. Left eye exhibits no discharge. No scleral icterus.  Neck: Neck supple. No JVD present. No tracheal deviation present. No thyromegaly present.  Cardiovascular: Normal rate, regular rhythm, normal heart sounds and intact distal pulses.  Exam reveals no gallop and no friction rub.   No murmur heard. Pulmonary/Chest: Effort normal and breath sounds normal. No respiratory distress. He has no wheezes. He has no rales. He exhibits no tenderness.  Abdominal: Soft. Bowel sounds are normal. He exhibits no distension and no mass. There is no tenderness. There is no rebound and no guarding.  Genitourinary: Rectum normal, prostate normal and penis normal. Guaiac negative stool. No penile tenderness.  Musculoskeletal: Normal range of motion. He exhibits no edema and no tenderness.  Lymphadenopathy:    He has no cervical adenopathy.  Neurological: He is alert and oriented to person, place, and time. He has normal reflexes. No cranial  nerve deficit. He exhibits normal muscle tone. Coordination normal.  Skin: Skin is warm and dry. No rash noted. He is not diaphoretic. No erythema. No pallor.  Psychiatric: He has a normal mood and affect. His behavior is normal. Judgment and thought content normal.          Assessment & Plan:  Well exam. Get fasting labs

## 2012-11-17 NOTE — Progress Notes (Signed)
Quick Note:  I spoke with pt ______ 

## 2013-05-19 ENCOUNTER — Ambulatory Visit (INDEPENDENT_AMBULATORY_CARE_PROVIDER_SITE_OTHER): Payer: 59 | Admitting: Family Medicine

## 2013-05-19 ENCOUNTER — Encounter: Payer: Self-pay | Admitting: Family Medicine

## 2013-05-19 VITALS — BP 128/70 | HR 125 | Temp 99.7°F | Wt 152.0 lb

## 2013-05-19 DIAGNOSIS — B349 Viral infection, unspecified: Secondary | ICD-10-CM

## 2013-05-19 DIAGNOSIS — B9789 Other viral agents as the cause of diseases classified elsewhere: Secondary | ICD-10-CM

## 2013-05-19 NOTE — Progress Notes (Signed)
  Subjective:    Patient ID: Jack Reid, male    DOB: August 07, 1976, 37 y.o.   MRN: [id number]  HPI Here for 4 days of fevers, sweats, body aches, HA, and a dry cough. No ST.    Review of Systems  Constitutional: Positive for fever, chills and diaphoresis.  HENT: Negative.   Eyes: Negative.   Respiratory: Positive for cough.   Gastrointestinal: Negative.   Musculoskeletal: Positive for myalgias.       Objective:   Physical Exam  Constitutional: He appears well-developed and well-nourished.  HENT:  Right Ear: External ear normal.  Left Ear: External ear normal.  Nose: Nose normal.  Mouth/Throat: Oropharynx is clear and moist.  Eyes: Conjunctivae are normal.  Pulmonary/Chest: Effort normal and breath sounds normal.  Lymphadenopathy:    He has no cervical adenopathy.          Assessment & Plan:  Rest, fluids, take Advil prn. Written out of work 05-17-13 through today.

## 2013-06-19 ENCOUNTER — Encounter (HOSPITAL_COMMUNITY): Payer: Self-pay | Admitting: Emergency Medicine

## 2013-06-19 ENCOUNTER — Emergency Department (HOSPITAL_COMMUNITY): Payer: 59

## 2013-06-19 ENCOUNTER — Emergency Department (HOSPITAL_COMMUNITY)
Admission: EM | Admit: 2013-06-19 | Discharge: 2013-06-19 | Disposition: A | Discharge status: Home / Self Care | Payer: 59 | Attending: Emergency Medicine | Admitting: Emergency Medicine

## 2013-06-19 ENCOUNTER — Telehealth: Payer: Self-pay | Admitting: Family Medicine

## 2013-06-19 DIAGNOSIS — B5809 Other toxoplasma oculopathy: Secondary | ICD-10-CM | POA: Insufficient documentation

## 2013-06-19 DIAGNOSIS — Z87891 Personal history of nicotine dependence: Secondary | ICD-10-CM | POA: Insufficient documentation

## 2013-06-19 DIAGNOSIS — F909 Attention-deficit hyperactivity disorder, unspecified type: Secondary | ICD-10-CM | POA: Insufficient documentation

## 2013-06-19 DIAGNOSIS — N23 Unspecified renal colic: Secondary | ICD-10-CM | POA: Insufficient documentation

## 2013-06-19 DIAGNOSIS — F329 Major depressive disorder, single episode, unspecified: Secondary | ICD-10-CM | POA: Insufficient documentation

## 2013-06-19 DIAGNOSIS — Z79899 Other long term (current) drug therapy: Secondary | ICD-10-CM | POA: Insufficient documentation

## 2013-06-19 DIAGNOSIS — N509 Disorder of male genital organs, unspecified: Secondary | ICD-10-CM | POA: Insufficient documentation

## 2013-06-19 DIAGNOSIS — F3289 Other specified depressive episodes: Secondary | ICD-10-CM | POA: Insufficient documentation

## 2013-06-19 DIAGNOSIS — F411 Generalized anxiety disorder: Secondary | ICD-10-CM | POA: Insufficient documentation

## 2013-06-19 LAB — CBC WITH DIFFERENTIAL/PLATELET
Basophils Absolute: 0 10*3/uL (ref 0.0–0.1)
Lymphocytes Relative: 20 % (ref 12–46)
Lymphs Abs: 2.8 10*3/uL (ref 0.7–4.0)
Neutro Abs: 9.9 10*3/uL — ABNORMAL HIGH (ref 1.7–7.7)
Neutrophils Relative %: 72 % (ref 43–77)
Platelets: 273 10*3/uL (ref 150–400)
RBC: 4.74 MIL/uL (ref 4.22–5.81)
RDW: 13 % (ref 11.5–15.5)
WBC: 13.8 10*3/uL — ABNORMAL HIGH (ref 4.0–10.5)

## 2013-06-19 LAB — URINE MICROSCOPIC-ADD ON

## 2013-06-19 LAB — URINALYSIS, ROUTINE W REFLEX MICROSCOPIC
Protein, ur: NEGATIVE mg/dL
Specific Gravity, Urine: 1.031 — ABNORMAL HIGH (ref 1.005–1.030)
Urobilinogen, UA: 0.2 mg/dL (ref 0.0–1.0)
pH: 7 (ref 5.0–8.0)

## 2013-06-19 LAB — BASIC METABOLIC PANEL
CO2: 24 mEq/L (ref 19–32)
GFR calc non Af Amer: >90 mL/min (ref >90)
Glucose, Bld: 140 mg/dL — ABNORMAL HIGH (ref 70–99)
Potassium: 3.8 mEq/L (ref 3.5–5.1)
Sodium: 135 mEq/L (ref 135–145)

## 2013-06-19 MED ORDER — HYDROCODONE-ACETAMINOPHEN 5-325 MG PO TABS: 1.0000 | ORAL_TABLET | Freq: Four times a day (QID) | ORAL | Status: DC | PRN

## 2013-06-19 MED ORDER — KETOROLAC TROMETHAMINE 30 MG/ML IJ SOLN
30.0000 mg | Freq: Once | INTRAMUSCULAR | Status: AC
  Administered 2013-06-19: 30 mg via INTRAVENOUS
  Filled 2013-06-19: qty 1

## 2013-06-19 MED ORDER — TAMSULOSIN HCL 0.4 MG PO CAPS: 0.4000 mg | ORAL_CAPSULE | Freq: Every day | ORAL | Status: DC

## 2013-06-19 MED ORDER — IBUPROFEN 600 MG PO TABS: 600.0000 mg | ORAL_TABLET | Freq: Four times a day (QID) | ORAL | Status: DC | PRN

## 2013-06-19 NOTE — ED Notes (Addendum)
Pt reports L sided flank pain, hematuria and frequency of urination since Saturday. Hx of kidney stones, reports nausea, denies vomiting. States pain radiating to LLQ of abdomen and groin.

## 2013-06-19 NOTE — ED Notes (Signed)
Patient transported to CT 

## 2013-06-19 NOTE — ED Provider Notes (Signed)
CSN: [id number]     Arrival date & time 06/19/13  4098 History   First MD Initiated Contact with Patient 06/19/13 [phone]     Chief Complaint  Patient presents with  . Flank Pain   (Consider location/radiation/quality/duration/timing/severity/associated sxs/prior Treatment) HPI Comments: Pt comes in with cc of abd pain. Has hx of kidney stones, and had some intermittent pain y'day, similar to his renal colic in the past. Pt was pain free last night, but then woke up with sudden onset pain at 4:30. He took 2 vicodin, pain persisted, so he came to the ER. The pain is located on the left lower quadrant, and radiates to the flank and to the testicle. He has had some hematuria, no dysuria, polyuria. Mild nausea, no emesis, fevers, chills.  Patient is a 37 y.o. male presenting with flank pain. The history is provided by the patient.  Flank Pain Associated symptoms include abdominal pain. Pertinent negatives include no chest pain and no shortness of breath.    Past Medical History  Diagnosis Date  . Anxiety   . Kidney stones   . Depression     sees Dr. Milagros Evener   . ADHD (attention deficit hyperactivity disorder)     sees Dr. Evelene Croon    Past Surgical History  Procedure Laterality Date  . Vasectomy     History reviewed. No pertinent family history. History  Substance Use Topics  . Smoking status: Former Smoker -- 1.00 packs/day    Types: Cigarettes  . Smokeless tobacco: Never Used     Comment: quit  . Alcohol Use: No    Review of Systems  Constitutional: Negative for activity change and appetite change.  Respiratory: Negative for cough and shortness of breath.   Cardiovascular: Negative for chest pain.  Gastrointestinal: Positive for abdominal pain. Negative for nausea and vomiting.  Genitourinary: Positive for flank pain and testicular pain. Negative for dysuria and difficulty urinating.    Allergies  Diphenhydramine hcl  Home Medications   Current Outpatient Rx  Name   Route  Sig  Dispense  Refill  . amphetamine-dextroamphetamine (ADDERALL) 20 MG tablet   Oral   Take 20 mg by mouth 3 (three) times daily.         . clonazePAM (KLONOPIN) 1 MG tablet   Oral   Take 1 mg by mouth 2 (two) times daily as needed.          Marland Kitchen ibuprofen (ADVIL,MOTRIN) 200 MG tablet   Oral   Take 200 mg by mouth every 6 (six) hours as needed for pain.          BP 146/83  Pulse 66  Temp(Src) 98.5 F (36.9 C) (Oral)  Resp 16  Ht 5\' 9"  (1.753 m)  Wt 160 lb (72.576 kg)  BMI 23.62 kg/m2  SpO2 100% Physical Exam  Nursing note and vitals reviewed. Constitutional: He is oriented to person, place, and time. He appears well-developed.  HENT:  Head: Normocephalic and atraumatic.  Eyes: Conjunctivae and EOM are normal. Pupils are equal, round, and reactive to light.  Neck: Normal range of motion. Neck supple.  Cardiovascular: Normal rate, regular rhythm and normal heart sounds.   Pulmonary/Chest: Effort normal and breath sounds normal. No respiratory distress. He has no wheezes.  Abdominal: Soft. Bowel sounds are normal. He exhibits no distension. There is no tenderness. There is no rebound and no guarding.  Mild tenderness in the left side, no rebound or guarding. +CVA tenderness  Genitourinary: Penis normal.  Pt's  scrotal exam reveals no testicular mass, free moving testicle, and no sign of hernia.  Neurological: He is alert and oriented to person, place, and time.  Skin: Skin is warm.    ED Course  Procedures (including critical care time) Labs Review Labs Reviewed  CBC WITH DIFFERENTIAL - Abnormal; Notable for the following:    WBC 13.8 (*)    Neutro Abs 9.9 (*)    All other components within normal limits  BASIC METABOLIC PANEL - Abnormal; Notable for the following:    Glucose, Bld 140 (*)    All other components within normal limits  URINE CULTURE  URINALYSIS, ROUTINE W REFLEX MICROSCOPIC   Imaging Review Ct Abdomen Pelvis Wo Contrast  06/19/2013    CLINICAL DATA:  Left flank pain, hematuria and increased frequency of urination for 2 days.  EXAM: CT ABDOMEN AND PELVIS WITHOUT CONTRAST  TECHNIQUE: Multidetector CT imaging of the abdomen and pelvis was performed following the standard protocol without intravenous contrast.  COMPARISON:  CT abdomen and pelvis 10/24/2011.  FINDINGS: The lung bases are clear. No pleural or pericardial effusion.  A 0.3 cm stone is seen at the left UVJ or just within the urinary bladder. There is no left hydronephrosis. No other urinary tract stones are identified.  The gallbladder, liver, spleen, adrenal glands and pancreas appear normal. There is no lymphadenopathy or fluid. The stomach, small and large bowel and appendix appear normal. 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 No other focal bony abnormality is identified.  IMPRESSION: 0.3 cm stone is either at the left UVJ or has just passed into the urinary bladder. There is no left hydronephrosis and no other urinary tract stones are seen.  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   Electronically Signed   By: Drusilla Kanner M.D.   On: 06/19/2013 07:56    MDM  No diagnosis found.  Pt comes in with cc of abd pain. Pt has hx of renal stones and comes in with abd pain, flank pain, radiating to the groin.  Non peritoneal exam, and with hx of renal stone, suspect kidney stone again. Last CT reviewed, 10/2011, and was neg for renal stone, so we got a CT this time. The results show possibly stone passage into the bladder. Pt was given toradol, and he is completely pain free. No diverticular dz appreciated by Radiologist. GU exam was benign, and torsion not suspected.  Will d/c with Urology f/u.  Return precautions have been discussed.    Derwood Kaplan, MD 06/19/13 [phone]

## 2013-06-19 NOTE — Telephone Encounter (Signed)
Patient Information:  Caller Name: [name]  Phone: [phone]  Patient: Jack Reid, Jack Reid  Gender: Male  DOB: August 07, 1976  Age: 37 Years  PCP: Gershon Crane [facility])  Office Follow Up:  Does the office need to follow up with this patient?: Yes  Instructions For The Office: request percocet script seen in the ER today  RN Note:  Patient was given Vicodin and Flomax and told to use Ibuprofen. Request script for Percocet for pain .   Pharmacy [facility] on [phone]  Symptoms  Reason For Call & Symptoms: Patient went to the ER this morning for Kidney Stone pain. He is having pain on Left flank pain moving toward bladder area. Patient was scanned and noted stone 3mm and moving.  He was given Rx for Vicodin but explained to them that only Percocet worked for him that his PCP gives him Percocet . They declined. Patient is asking for Percocet  Reviewed Health History In EMR: Yes  Reviewed Medications In EMR: Yes  Reviewed Allergies In EMR: Yes  Reviewed Surgeries / Procedures: Yes  Date of Onset of Symptoms: 06/20/2013  Guideline(s) Used:  Urination Pain - Male  Disposition Per Guideline:   Go to Office Now  Reason For Disposition Reached:   Side (flank) or lower back pain present  Advice Given:  Call Back If:  You become worse.  RN Overrode Recommendation:  Patient Requests Prescription  Request percocet

## 2013-06-20 LAB — URINE CULTURE
Colony Count: NO GROWTH
Culture: NO GROWTH

## 2013-12-13 ENCOUNTER — Telehealth: Payer: Self-pay | Admitting: Family Medicine

## 2013-12-13 ENCOUNTER — Ambulatory Visit (INDEPENDENT_AMBULATORY_CARE_PROVIDER_SITE_OTHER): Payer: 59 | Admitting: Family Medicine

## 2013-12-13 ENCOUNTER — Encounter: Payer: Self-pay | Admitting: Family Medicine

## 2013-12-13 VITALS — BP 128/74 | HR 120 | Temp 98.7°F | Ht 69.25 in | Wt 150.0 lb

## 2013-12-13 DIAGNOSIS — Z0000 Encounter for general adult medical examination without abnormal findings: Secondary | ICD-10-CM

## 2013-12-13 LAB — CBC WITH DIFFERENTIAL/PLATELET
BASOS PCT: 0.3 % (ref 0.0–3.0)
Basophils Absolute: 0 10*3/uL (ref 0.0–0.1)
EOS PCT: 0.6 % (ref 0.0–5.0)
Eosinophils Absolute: 0 10*3/uL (ref 0.0–0.7)
HEMATOCRIT: 41.9 % (ref 39.0–52.0)
Hemoglobin: 14.3 g/dL (ref 13.0–17.0)
Lymphocytes Relative: 28.4 % (ref 12.0–46.0)
Lymphs Abs: 2.2 10*3/uL (ref 0.7–4.0)
MCHC: 34.1 g/dL (ref 30.0–36.0)
MCV: 94.2 fl (ref 78.0–100.0)
MONOS PCT: 9 % (ref 3.0–12.0)
Monocytes Absolute: 0.7 10*3/uL (ref 0.1–1.0)
NEUTROS PCT: 61.7 % (ref 43.0–77.0)
Neutro Abs: 4.7 10*3/uL (ref 1.4–7.7)
Platelets: 289 10*3/uL (ref 150.0–400.0)
RBC: 4.45 Mil/uL (ref 4.22–5.81)
RDW: 13.3 % (ref 11.5–14.6)
WBC: 7.7 10*3/uL (ref 4.5–10.5)

## 2013-12-13 LAB — LIPID PANEL
CHOL/HDL RATIO: 6
Cholesterol: 193 mg/dL (ref 0–200)
HDL: 32.4 mg/dL — ABNORMAL LOW (ref >39.00)
LDL Cholesterol: 119 mg/dL — ABNORMAL HIGH (ref 0–99)
Triglycerides: 208 mg/dL — ABNORMAL HIGH (ref 0.0–149.0)
VLDL: 41.6 mg/dL — ABNORMAL HIGH (ref 0.0–40.0)

## 2013-12-13 LAB — POCT URINALYSIS DIPSTICK
BILIRUBIN UA: NEGATIVE
Clarity, UA: NEGATIVE
GLUCOSE UA: NEGATIVE
Ketones, UA: NEGATIVE
Leukocytes, UA: NEGATIVE
NITRITE UA: NEGATIVE
RBC UA: NEGATIVE
SPEC GRAV UA: 1.02
Urobilinogen, UA: 0.2
pH, UA: 7.5

## 2013-12-13 LAB — BASIC METABOLIC PANEL
BUN: 13 mg/dL (ref 6–23)
CALCIUM: 9 mg/dL (ref 8.4–10.5)
CO2: 26 mEq/L (ref 19–32)
Chloride: 103 mEq/L (ref 96–112)
Creatinine, Ser: 0.9 mg/dL (ref 0.4–1.5)
GFR: 108.58 mL/min (ref >60.00)
Glucose, Bld: 91 mg/dL (ref 70–99)
Potassium: 3.3 mEq/L — ABNORMAL LOW (ref 3.5–5.1)
SODIUM: 136 meq/L (ref 135–145)

## 2013-12-13 LAB — TSH: TSH: 1.01 u[IU]/mL (ref 0.35–5.50)

## 2013-12-13 LAB — HEPATIC FUNCTION PANEL
ALT: 17 U/L (ref 0–53)
AST: 18 U/L (ref 0–37)
Albumin: 4.2 g/dL (ref 3.5–5.2)
Alkaline Phosphatase: 62 U/L (ref 39–117)
Bilirubin, Direct: 0.1 mg/dL (ref 0.0–0.3)
Total Bilirubin: 0.4 mg/dL (ref 0.3–1.2)
Total Protein: 7.2 g/dL (ref 6.0–8.3)

## 2013-12-13 NOTE — Progress Notes (Signed)
   Subjective:    Patient ID: Jack BryantMichael Cuppett, male    DOB: 1976-03-02, 38 y.o.   MRN: [id number]  HPI 38 yr old male for a cpx. He feels great. He sees Dr. Evelene CroonKaur regularly.    Review of Systems  Constitutional: Negative.   HENT: Negative.   Eyes: Negative.   Respiratory: Negative.   Cardiovascular: Negative.   Gastrointestinal: Negative.   Genitourinary: Negative.   Musculoskeletal: Negative.   Skin: Negative.   Neurological: Negative.   Psychiatric/Behavioral: Negative.        Objective:   Physical Exam  Constitutional: He is oriented to person, place, and time. He appears well-developed and well-nourished. No distress.  HENT:  Head: Normocephalic and atraumatic.  Right Ear: External ear normal.  Left Ear: External ear normal.  Nose: Nose normal.  Mouth/Throat: Oropharynx is clear and moist. No oropharyngeal exudate.  Eyes: Conjunctivae and EOM are normal. Pupils are equal, round, and reactive to light. Right eye exhibits no discharge. Left eye exhibits no discharge. No scleral icterus.  Neck: Neck supple. No JVD present. No tracheal deviation present. No thyromegaly present.  Cardiovascular: Normal rate, regular rhythm, normal heart sounds and intact distal pulses.  Exam reveals no gallop and no friction rub.   No murmur heard. Pulmonary/Chest: Effort normal and breath sounds normal. No respiratory distress. He has no wheezes. He has no rales. He exhibits no tenderness.  Abdominal: Soft. Bowel sounds are normal. He exhibits no distension and no mass. There is no tenderness. There is no rebound and no guarding.  Genitourinary: Rectum normal, prostate normal and penis normal. Guaiac negative stool. No penile tenderness.  Musculoskeletal: Normal range of motion. He exhibits no edema and no tenderness.  Lymphadenopathy:    He has no cervical adenopathy.  Neurological: He is alert and oriented to person, place, and time. He has normal reflexes. No cranial nerve deficit. He  exhibits normal muscle tone. Coordination normal.  Skin: Skin is warm and dry. No rash noted. He is not diaphoretic. No erythema. No pallor.  Psychiatric: He has a normal mood and affect. His behavior is normal. Judgment and thought content normal.          Assessment & Plan:  Well exam. Get fasting labs.

## 2013-12-13 NOTE — Progress Notes (Signed)
Pre visit review using our clinic review tool, if applicable. No additional management support is needed unless otherwise documented below in the visit note. 

## 2013-12-13 NOTE — Telephone Encounter (Signed)
Relevant patient education mailed to patient.  

## 2014-08-30 ENCOUNTER — Encounter: Payer: Self-pay | Admitting: Family Medicine

## 2014-08-30 ENCOUNTER — Ambulatory Visit (INDEPENDENT_AMBULATORY_CARE_PROVIDER_SITE_OTHER): Payer: 59 | Admitting: Family Medicine

## 2014-08-30 VITALS — BP 117/77 | HR 105 | Temp 98.5°F | Ht 69.25 in | Wt 163.0 lb

## 2014-08-30 DIAGNOSIS — B349 Viral infection, unspecified: Secondary | ICD-10-CM

## 2014-08-30 NOTE — Progress Notes (Signed)
Pre visit review using our clinic review tool, if applicable. No additional management support is needed unless otherwise documented below in the visit note. 

## 2014-08-30 NOTE — Patient Instructions (Signed)
Ask pharmacist for generic allegra D 12 hour and take as directed on packaging.

## 2014-08-30 NOTE — Progress Notes (Signed)
OFFICE NOTE  08/30/2014  CC: "not feeling good" HPI: Patient is a 38 y.o. Caucasian male who is here for 3 days of sneezing, runny nose, fatigue, diarrhea (some crampy stomach, 2-5 watery BMs/24h), subjective fever last night.  A little cough.  Feeling achy in entire body.  No n/v.  No rash.  No HA or ST.  NO meds taken for his sx's. No recent travel.  He works at [employer].   Pertinent PMH:  Past surgical, social, and family history reviewed and no changes noted since last office visit.  MEDS:  Outpatient Prescriptions Prior to Visit  Medication Sig Dispense Refill  . amphetamine-dextroamphetamine (ADDERALL) 20 MG tablet Take 20 mg by mouth 4 (four) times daily.     . clonazePAM (KLONOPIN) 1 MG tablet Take 1 mg by mouth 2 (two) times daily as needed.     Marland Kitchen. HYDROcodone-acetaminophen (NORCO/VICODIN) 5-325 MG per tablet Take 1 tablet by mouth every 6 (six) hours as needed for pain. (Patient not taking: Reported on 08/30/2014) 15 tablet 0  . ibuprofen (ADVIL,MOTRIN) 200 MG tablet Take 200 mg by mouth every 6 (six) hours as needed for pain.     No facility-administered medications prior to visit.    PE: Blood pressure 117/77, pulse 105, temperature 98.5 F (36.9 C), temperature source Temporal, height 5' 9.25" (1.759 m), weight 163 lb (73.936 kg), SpO2 97 %. VS: noted--normal. Gen: alert, NAD, NONTOXIC APPEARING. HEENT: eyes without injection, drainage, or swelling.  Ears: EACs clear, TMs with normal light reflex and landmarks.  Nose: Clear rhinorrhea, with some dried, crusty exudate adherent to mildly injected mucosa.  No purulent d/c.  No paranasal sinus TTP.  No facial swelling.  Throat and mouth without focal lesion.  No pharyngial swelling, erythema, or exudate.   Neck: supple, no LAD.   LUNGS: CTA bilat, nonlabored resps.   ABD: soft, NT, ND, BS normal.  No hepatospenomegaly or mass.  No bruits. CV: RRR, no m/r/g. EXT: no c/c/e SKIN: no rash    IMPRESSION AND  PLAN:  Viral syndrome. No sign of dehydration. Reassured.  Symptomatic care, push fluids,rest. Signs/symptoms to call or return for were reviewed and pt expressed understanding.  An After Visit Summary was printed and given to the patient.   FOLLOW UP: prn

## 2014-08-31 ENCOUNTER — Telehealth: Payer: Self-pay | Admitting: Family Medicine

## 2014-08-31 NOTE — Telephone Encounter (Signed)
emmi emailed °

## 2014-10-13 ENCOUNTER — Emergency Department (HOSPITAL_COMMUNITY)
Admission: EM | Admit: 2014-10-13 | Discharge: 2014-10-13 | Disposition: A | Discharge status: Home / Self Care | Payer: 59 | Attending: Emergency Medicine | Admitting: Emergency Medicine

## 2014-10-13 ENCOUNTER — Encounter (HOSPITAL_COMMUNITY): Payer: Self-pay | Admitting: Emergency Medicine

## 2014-10-13 DIAGNOSIS — Z720 Tobacco use: Secondary | ICD-10-CM | POA: Insufficient documentation

## 2014-10-13 DIAGNOSIS — K029 Dental caries, unspecified: Secondary | ICD-10-CM | POA: Insufficient documentation

## 2014-10-13 DIAGNOSIS — F329 Major depressive disorder, single episode, unspecified: Secondary | ICD-10-CM | POA: Diagnosis not present

## 2014-10-13 DIAGNOSIS — Z87442 Personal history of urinary calculi: Secondary | ICD-10-CM | POA: Diagnosis not present

## 2014-10-13 DIAGNOSIS — F909 Attention-deficit hyperactivity disorder, unspecified type: Secondary | ICD-10-CM | POA: Diagnosis not present

## 2014-10-13 DIAGNOSIS — F419 Anxiety disorder, unspecified: Secondary | ICD-10-CM | POA: Diagnosis not present

## 2014-10-13 DIAGNOSIS — K0889 Other specified disorders of teeth and supporting structures: Secondary | ICD-10-CM

## 2014-10-13 DIAGNOSIS — K088 Other specified disorders of teeth and supporting structures: Secondary | ICD-10-CM | POA: Insufficient documentation

## 2014-10-13 MED ORDER — AMOXICILLIN 500 MG PO CAPS: 500.0000 mg | ORAL_CAPSULE | Freq: Three times a day (TID) | ORAL | Status: DC

## 2014-10-13 MED ORDER — HYDROCODONE-ACETAMINOPHEN 5-325 MG PO TABS: 2.0000 | ORAL_TABLET | ORAL | Status: DC | PRN

## 2014-10-13 NOTE — Discharge Instructions (Signed)

## 2014-10-13 NOTE — ED Provider Notes (Signed)
CSN: [id number]     Arrival date & time 10/13/14  1245 History  This chart was scribed for non-physician practitioner, Langston MaskerKaren Sofia, PA-C,working with Doug SouSam Jacubowitz, MD, by Karle PlumberJennifer Tensley, ED Scribe. This patient was seen in room WTR5/WTR5 and the patient's care was started at 2:01 PM.  Chief Complaint  Patient presents with  . Dental Pain   The history is provided by the patient and medical records. No language interpreter was used.    HPI Comments:  Jack Reid is a 39 y.o. male who presents to the Emergency Department complaining of severe left lower dental pain that began three days ago. Pt states he fractured the tooth approximately 7 months ago and has not been treated. He reports associated severe HA. Pt states he has been using Ambesol with no significant relief of the pain. Eating makes the pain worse. Denies alleviating factors. Denies fever, chills, drainage from the tooth, inability to swallow, nausea, vomiting or diarrhea. PMH of anxiety, depression, ADHD and kidney stones.  Past Medical History  Diagnosis Date  . Anxiety   . Kidney stones   . Depression     sees Dr. Milagros Evenerupinder Kaur   . ADHD (attention deficit hyperactivity disorder)     sees Dr. Evelene CroonKaur    Past Surgical History  Procedure Laterality Date  . Vasectomy     History reviewed. No pertinent family history. History  Substance Use Topics  . Smoking status: Current Every Day Smoker -- 1.00 packs/day    Types: Cigarettes  . Smokeless tobacco: Never Used     Comment: less than 1 pack per day  . Alcohol Use: No    Review of Systems  Constitutional: Negative for fever and chills.  HENT: Positive for dental problem. Negative for trouble swallowing.   Gastrointestinal: Negative for nausea, vomiting and diarrhea.  All other systems reviewed and are negative.   Allergies  Diphenhydramine hcl  Home Medications   Prior to Admission medications   Medication Sig Start Date End Date Taking? Authorizing Provider   amphetamine-dextroamphetamine (ADDERALL) 20 MG tablet Take 20 mg by mouth 4 (four) times daily.     Historical Provider, MD  clonazePAM (KLONOPIN) 1 MG tablet Take 1 mg by mouth 2 (two) times daily as needed.     Historical Provider, MD   BP 149/71 mmHg  Pulse 85  Temp(Src) 98 F (36.7 C) (Oral)  Resp 17  SpO2 98% Physical Exam  Constitutional: He is oriented to person, place, and time. He appears well-developed and well-nourished.  HENT:  Head: Normocephalic and atraumatic.  Mouth/Throat: Uvula is midline, oropharynx is clear and moist and mucous membranes are normal. No oral lesions. No trismus in the jaw. Abnormal dentition. Lacerations and dental caries present. No dental abscesses or uvula swelling.  Large cavity noted to left second lower molar.  Eyes: EOM are normal.  Neck: Normal range of motion.  Cardiovascular: Normal rate.   Pulmonary/Chest: Effort normal.  Musculoskeletal: Normal range of motion.  Neurological: He is alert and oriented to person, place, and time.  Skin: Skin is warm and dry.  Psychiatric: He has a normal mood and affect. His behavior is normal.  Nursing note and vitals reviewed.   ED Course  Procedures (including critical care time) DIAGNOSTIC STUDIES:   COORDINATION OF CARE: 2:08 PM- Will prescribe pain medication, antibiotics and refer to dentist. Pt verbalizes understanding and agrees to plan.  Medications - No data to display  Labs Review Labs Reviewed - No data to display  Imaging Review No results found.   EKG Interpretation None      MDM   Final diagnoses:  Toothache    Meds ordered this encounter  Medications  . HYDROcodone-acetaminophen (NORCO/VICODIN) 5-325 MG per tablet    Sig: Take 2 tablets by mouth every 4 (four) hours as needed for moderate pain or severe pain.    Dispense:  16 tablet    Refill:  0    Order Specific Question:  Supervising Provider    Answer:  Eber Hong D [[id number]]  . amoxicillin (AMOXIL) 500  MG capsule    Sig: Take 1 capsule (500 mg total) by mouth 3 (three) times daily.    Dispense:  21 capsule    Refill:  0    Order Specific Question:  Supervising Provider    Answer:  Eber Hong D [[id number]]    I personally performed the services described in this documentation, which was scribed in my presence. The recorded information has been reviewed and is accurate.    Elson Areas, PA-C 10/13/14 2018  Doug Sou, MD 10/14/14 [phone]

## 2014-10-13 NOTE — ED Notes (Signed)
Pt c/o left lower back molar dental pain. Broke the tooth in June and has not had medical treatment since then. Used Ambisol today with no alleviation of symptoms. Denies N/V/D/fevers. Has had a headache. No other symptoms. No drainage noted. Left side of mouth is sore. Slight swelling noted.

## 2014-12-17 ENCOUNTER — Encounter: Payer: Self-pay | Admitting: Family Medicine

## 2014-12-17 ENCOUNTER — Ambulatory Visit (INDEPENDENT_AMBULATORY_CARE_PROVIDER_SITE_OTHER): Payer: 59 | Admitting: Family Medicine

## 2014-12-17 VITALS — BP 120/73 | HR 65 | Temp 98.4°F | Ht 69.25 in | Wt 157.0 lb

## 2014-12-17 DIAGNOSIS — Z0000 Encounter for general adult medical examination without abnormal findings: Secondary | ICD-10-CM | POA: Diagnosis not present

## 2014-12-17 LAB — BASIC METABOLIC PANEL
BUN: 13 mg/dL (ref 6–23)
CO2: 28 mEq/L (ref 19–32)
Calcium: 9.3 mg/dL (ref 8.4–10.5)
Chloride: 106 mEq/L (ref 96–112)
Creatinine, Ser: 0.83 mg/dL (ref 0.40–1.50)
GFR: 110.98 mL/min (ref >60.00)
Glucose, Bld: 98 mg/dL (ref 70–99)
POTASSIUM: 3.8 meq/L (ref 3.5–5.1)
SODIUM: 138 meq/L (ref 135–145)

## 2014-12-17 LAB — CBC WITH DIFFERENTIAL/PLATELET
Basophils Absolute: 0 10*3/uL (ref 0.0–0.1)
Basophils Relative: 0.5 % (ref 0.0–3.0)
Eosinophils Absolute: 0.1 10*3/uL (ref 0.0–0.7)
Eosinophils Relative: 1.3 % (ref 0.0–5.0)
HCT: 44.6 % (ref 39.0–52.0)
Hemoglobin: 15.2 g/dL (ref 13.0–17.0)
Lymphocytes Relative: 31.3 % (ref 12.0–46.0)
Lymphs Abs: 2.8 10*3/uL (ref 0.7–4.0)
MCHC: 34.1 g/dL (ref 30.0–36.0)
MCV: 94 fl (ref 78.0–100.0)
Monocytes Absolute: 0.7 10*3/uL (ref 0.1–1.0)
Monocytes Relative: 8 % (ref 3.0–12.0)
Neutro Abs: 5.3 10*3/uL (ref 1.4–7.7)
Neutrophils Relative %: 58.9 % (ref 43.0–77.0)
Platelets: 292 10*3/uL (ref 150.0–400.0)
RBC: 4.74 Mil/uL (ref 4.22–5.81)
RDW: 13.9 % (ref 11.5–15.5)
WBC: 9 10*3/uL (ref 4.0–10.5)

## 2014-12-17 LAB — TSH: TSH: 2.19 u[IU]/mL (ref 0.35–4.50)

## 2014-12-17 LAB — HEPATIC FUNCTION PANEL
ALT: 15 U/L (ref 0–53)
AST: 15 U/L (ref 0–37)
Albumin: 4 g/dL (ref 3.5–5.2)
Alkaline Phosphatase: 79 U/L (ref 39–117)
Bilirubin, Direct: 0 mg/dL (ref 0.0–0.3)
Total Bilirubin: 0.2 mg/dL (ref 0.2–1.2)
Total Protein: 6.9 g/dL (ref 6.0–8.3)

## 2014-12-17 LAB — LIPID PANEL
Cholesterol: 214 mg/dL — ABNORMAL HIGH (ref 0–200)
HDL: 33 mg/dL — ABNORMAL LOW (ref >39.00)
NonHDL: 181
Total CHOL/HDL Ratio: 6
Triglycerides: 318 mg/dL — ABNORMAL HIGH (ref 0.0–149.0)
VLDL: 63.6 mg/dL — ABNORMAL HIGH (ref 0.0–40.0)

## 2014-12-17 LAB — LDL CHOLESTEROL, DIRECT: Direct LDL: 127 mg/dL

## 2014-12-17 NOTE — Progress Notes (Signed)
Pre visit review using our clinic review tool, if applicable. No additional management support is needed unless otherwise documented below in the visit note. 

## 2014-12-17 NOTE — Progress Notes (Signed)
   Subjective:    Patient ID: Jack Reid, male    DOB: 1975/09/23, 39 y.o.   MRN: [id number]  HPI 39 yr old male for a cpx. He feels well. He sees Dr. Evelene CroonKaur every 6 months.   Review of Systems  Constitutional: Negative.   HENT: Negative.   Eyes: Negative.   Respiratory: Negative.   Cardiovascular: Negative.   Gastrointestinal: Negative.   Genitourinary: Negative.   Musculoskeletal: Negative.   Skin: Negative.   Neurological: Negative.   Psychiatric/Behavioral: Negative.        Objective:   Physical Exam  Constitutional: He is oriented to person, place, and time. He appears well-developed and well-nourished. No distress.  HENT:  Head: Normocephalic and atraumatic.  Right Ear: External ear normal.  Left Ear: External ear normal.  Nose: Nose normal.  Mouth/Throat: Oropharynx is clear and moist. No oropharyngeal exudate.  Eyes: Conjunctivae and EOM are normal. Pupils are equal, round, and reactive to light. Right eye exhibits no discharge. Left eye exhibits no discharge. No scleral icterus.  Neck: Neck supple. No JVD present. No tracheal deviation present. No thyromegaly present.  Cardiovascular: Normal rate, regular rhythm, normal heart sounds and intact distal pulses.  Exam reveals no gallop and no friction rub.   No murmur heard. Pulmonary/Chest: Effort normal and breath sounds normal. No respiratory distress. He has no wheezes. He has no rales. He exhibits no tenderness.  Abdominal: Soft. Bowel sounds are normal. He exhibits no distension and no mass. There is no tenderness. There is no rebound and no guarding.  Genitourinary: Rectum normal, prostate normal and penis normal. Guaiac negative stool. No penile tenderness.  Musculoskeletal: Normal range of motion. He exhibits no edema or tenderness.  Lymphadenopathy:    He has no cervical adenopathy.  Neurological: He is alert and oriented to person, place, and time. He has normal reflexes. No cranial nerve deficit. He  exhibits normal muscle tone. Coordination normal.  Skin: Skin is warm and dry. No rash noted. He is not diaphoretic. No erythema. No pallor.  Psychiatric: He has a normal mood and affect. His behavior is normal. Judgment and thought content normal.          Assessment & Plan:  Well exam. Get fasting labs.

## 2014-12-20 ENCOUNTER — Other Ambulatory Visit: Payer: Self-pay | Admitting: *Deleted

## 2014-12-20 MED ORDER — ATORVASTATIN CALCIUM 20 MG PO TABS: 20.0000 mg | ORAL_TABLET | Freq: Every day | ORAL | Status: DC

## 2015-06-03 ENCOUNTER — Ambulatory Visit (INDEPENDENT_AMBULATORY_CARE_PROVIDER_SITE_OTHER): Payer: Commercial Managed Care - PPO | Admitting: Family Medicine

## 2015-06-03 ENCOUNTER — Encounter: Payer: Self-pay | Admitting: Family Medicine

## 2015-06-03 VITALS — BP 133/72 | HR 68 | Temp 99.0°F | Ht 69.25 in | Wt 160.0 lb

## 2015-06-03 DIAGNOSIS — B349 Viral infection, unspecified: Secondary | ICD-10-CM | POA: Diagnosis not present

## 2015-06-03 NOTE — Progress Notes (Signed)
   Subjective:    Patient ID: Jack Reid, male    DOB: August 01, 1976, 39 y.o.   MRN: [id number]  HPI Here for the onset last night of stuffy head, PND, ST, abdominal cramps, diarrhea, and a dry cough. No nausea or vomiting. Drinking fluids. His young son had a similar infection over the weekend but he seems fine today.    Review of Systems  Constitutional: Negative.   HENT: Positive for congestion and postnasal drip. Negative for sinus pressure.   Eyes: Negative.   Respiratory: Positive for cough.   Cardiovascular: Negative.   Gastrointestinal: Positive for abdominal pain and diarrhea. Negative for nausea, vomiting, constipation, blood in stool, abdominal distention, anal bleeding and rectal pain.  Genitourinary: Negative.        Objective:   Physical Exam  Constitutional: He appears well-developed and well-nourished. No distress.  HENT:  Right Ear: External ear normal.  Left Ear: External ear normal.  Nose: Nose normal.  Mouth/Throat: Oropharynx is clear and moist.  Eyes: Conjunctivae are normal.  Neck: Neck supple. No thyromegaly present.  Pulmonary/Chest: Effort normal and breath sounds normal. No respiratory distress. He has no wheezes. He has no rales.  Abdominal: Soft. Bowel sounds are normal. He exhibits no distension and no mass. There is no tenderness. There is no rebound and no guarding.  Lymphadenopathy:    He has no cervical adenopathy.          Assessment & Plan:  Viral illness. Drink fluids, use Imodium prn. Recheck prn

## 2015-06-03 NOTE — Progress Notes (Signed)
Pre visit review using our clinic review tool, if applicable. No additional management support is needed unless otherwise documented below in the visit note. 

## 2015-12-12 ENCOUNTER — Ambulatory Visit: Payer: Commercial Managed Care - PPO

## 2015-12-12 VITALS — BP 126/62 | HR 81 | Temp 98.3°F | Resp 18 | Ht 70.0 in | Wt 157.0 lb

## 2015-12-12 DIAGNOSIS — IMO0001 Reserved for inherently not codable concepts without codable children: Secondary | ICD-10-CM

## 2015-12-12 NOTE — Patient Instructions (Signed)
     IF you received an x-ray today, you will receive an invoice from [facility]. Please contact [facility] at [phone] with questions or concerns regarding your invoice.   IF you received labwork today, you will receive an invoice from [facility]. Please contact [facility] at [phone] with questions or concerns regarding your invoice.   Our billing staff will not be able to assist you with questions regarding bills from these companies.  You will be contacted with the lab results as soon as they are available. The fastest way to get your results is to activate your My Chart account. Instructions are located on the last page of this paperwork. If you have not heard from us regarding the results in 2 weeks, please contact this office.      

## 2015-12-21 DIAGNOSIS — IMO0001 Reserved for inherently not codable concepts without codable children: Secondary | ICD-10-CM | POA: Insufficient documentation

## 2015-12-21 NOTE — Progress Notes (Unsigned)
Left without being seen.   This encounter was created in error - please disregard.

## 2015-12-23 ENCOUNTER — Encounter: Payer: Commercial Managed Care - PPO | Admitting: Family Medicine

## 2016-06-02 ENCOUNTER — Encounter (HOSPITAL_COMMUNITY): Payer: Self-pay

## 2016-06-02 ENCOUNTER — Emergency Department (HOSPITAL_COMMUNITY): Payer: Commercial Managed Care - PPO

## 2016-06-02 ENCOUNTER — Emergency Department (HOSPITAL_COMMUNITY)
Admission: EM | Admit: 2016-06-02 | Discharge: 2016-06-02 | Disposition: A | Discharge status: Home / Self Care | Payer: Commercial Managed Care - PPO | Attending: Emergency Medicine | Admitting: Emergency Medicine

## 2016-06-02 DIAGNOSIS — F909 Attention-deficit hyperactivity disorder, unspecified type: Secondary | ICD-10-CM | POA: Diagnosis not present

## 2016-06-02 DIAGNOSIS — N23 Unspecified renal colic: Secondary | ICD-10-CM | POA: Diagnosis not present

## 2016-06-02 DIAGNOSIS — Z79899 Other long term (current) drug therapy: Secondary | ICD-10-CM | POA: Insufficient documentation

## 2016-06-02 DIAGNOSIS — R110 Nausea: Secondary | ICD-10-CM | POA: Insufficient documentation

## 2016-06-02 DIAGNOSIS — F17210 Nicotine dependence, cigarettes, uncomplicated: Secondary | ICD-10-CM | POA: Insufficient documentation

## 2016-06-02 DIAGNOSIS — R109 Unspecified abdominal pain: Secondary | ICD-10-CM | POA: Diagnosis present

## 2016-06-02 LAB — BASIC METABOLIC PANEL
Anion gap: 10 (ref 5–15)
BUN: 13 mg/dL (ref 6–20)
CALCIUM: 9.6 mg/dL (ref 8.9–10.3)
CO2: 21 mmol/L — ABNORMAL LOW (ref 22–32)
CREATININE: 1.02 mg/dL (ref 0.61–1.24)
Chloride: 107 mmol/L (ref 101–111)
Glucose, Bld: 119 mg/dL — ABNORMAL HIGH (ref 65–99)
Potassium: 3.7 mmol/L (ref 3.5–5.1)
Sodium: 138 mmol/L (ref 135–145)

## 2016-06-02 LAB — CBC WITH DIFFERENTIAL/PLATELET
Basophils Absolute: 0 10*3/uL (ref 0.0–0.1)
Basophils Relative: 0 %
EOS ABS: 0.1 10*3/uL (ref 0.0–0.7)
Eosinophils Relative: 1 %
HCT: 44.5 % (ref 39.0–52.0)
HEMOGLOBIN: 15.3 g/dL (ref 13.0–17.0)
Lymphocytes Relative: 24 %
Lymphs Abs: 3.5 10*3/uL (ref 0.7–4.0)
MCH: 31.9 pg (ref 26.0–34.0)
MCHC: 34.4 g/dL (ref 30.0–36.0)
MCV: 92.7 fL (ref 78.0–100.0)
MONO ABS: 0.9 10*3/uL (ref 0.1–1.0)
MONOS PCT: 6 %
NEUTROS PCT: 69 %
Neutro Abs: 10 10*3/uL — ABNORMAL HIGH (ref 1.7–7.7)
PLATELETS: 320 10*3/uL (ref 150–400)
RBC: 4.8 MIL/uL (ref 4.22–5.81)
RDW: 13.1 % (ref 11.5–15.5)
WBC: 14.5 10*3/uL — ABNORMAL HIGH (ref 4.0–10.5)

## 2016-06-02 LAB — URINALYSIS, ROUTINE W REFLEX MICROSCOPIC
BILIRUBIN URINE: NEGATIVE
Glucose, UA: NEGATIVE mg/dL
KETONES UR: NEGATIVE mg/dL
Nitrite: NEGATIVE
Protein, ur: 30 mg/dL — AB
Specific Gravity, Urine: 1.026 (ref 1.005–1.030)
pH: 6.5 (ref 5.0–8.0)

## 2016-06-02 LAB — URINE MICROSCOPIC-ADD ON
Bacteria, UA: NONE SEEN
SQUAMOUS EPITHELIAL / LPF: NONE SEEN

## 2016-06-02 MED ORDER — TAMSULOSIN HCL 0.4 MG PO CAPS: 0.4000 mg | ORAL_CAPSULE | Freq: Every day | ORAL | 0 refills | Status: DC

## 2016-06-02 MED ORDER — IBUPROFEN 600 MG PO TABS: 600.0000 mg | ORAL_TABLET | Freq: Four times a day (QID) | ORAL | 0 refills | Status: DC | PRN

## 2016-06-02 MED ORDER — OXYCODONE-ACETAMINOPHEN 5-325 MG PO TABS
1.0000 | ORAL_TABLET | ORAL | 0 refills | Status: DC | PRN
Start: 2016-06-02 — End: 2018-05-15

## 2016-06-02 MED ORDER — KETOROLAC TROMETHAMINE 30 MG/ML IJ SOLN
30.0000 mg | Freq: Once | INTRAMUSCULAR | Status: AC
  Administered 2016-06-02: 30 mg via INTRAVENOUS
  Filled 2016-06-02: qty 1

## 2016-06-02 MED ORDER — FENTANYL CITRATE (PF) 100 MCG/2ML IJ SOLN
100.0000 ug | Freq: Once | INTRAMUSCULAR | Status: AC
  Administered 2016-06-02: 100 ug via INTRAVENOUS
  Filled 2016-06-02: qty 2

## 2016-06-02 NOTE — Discharge Instructions (Signed)
Ibuprofen for pain. If severe pain comes back, take percocet as prescribed as needed. Take flomax if continue to have pain to help you pass the stone. If no improvement, follow up with your urologist. Return if uncontrolled pain, vomiting, fever.

## 2016-06-02 NOTE — ED Triage Notes (Signed)
Pt complains of right flank pain that started about 45 minutes ago, hx of kidney stones and pain is the same but higher

## 2016-06-02 NOTE — ED Provider Notes (Signed)
[facility]-EMERGENCY DEPT Provider Note   CSN: [id number] Arrival date & time: 06/02/16  0509     History   Chief Complaint Chief Complaint  Patient presents with  . Flank Pain    HPI Jack Reid is a 40 y.o. male.  HPI Jack Reid is a 40 y.o. male with hx of depression, anxiety, kidney stones, presents to ED with acute onset of right flank pain which started this morning. Pt states pain is now radiating to the right lower abdomen. Pain is sharp. Similar to prior kidney stones. Reports associated nausea, no vomiting. Denies fever or chills. No urinary symptoms, states urine is dark. Hx of prior kidney stones, feels the same. Has not required any procedures in the past. States last stone about 3 years ago.   Past Medical History:  Diagnosis Date  . ADHD (attention deficit hyperactivity disorder)    sees Dr. Evelene Croon   . Anxiety   . Depression    sees Dr. Milagros Evener   . Kidney stones     Patient Active Problem List   Diagnosis Date Noted  . No diagnosis 12/21/2015  . ANXIETY 10/05/2008  . DEPRESSION 08/03/2008  . NEPHROLITHIASIS, HX OF 08/03/2008    Past Surgical History:  Procedure Laterality Date  . VASECTOMY         Home Medications    Prior to Admission medications   Medication Sig Start Date End Date Taking? Authorizing Provider  amphetamine-dextroamphetamine (ADDERALL) 20 MG tablet Take 20 mg by mouth 4 (four) times daily.     Historical Provider, MD  ibuprofen (ADVIL,MOTRIN) 200 MG tablet Take 200 mg by mouth every 6 (six) hours as needed.    Historical Provider, MD    Family History Family History  Problem Relation Age of Onset  . Diabetes Father   . Hyperlipidemia Father   . Heart disease Father     Social History Social History  Substance Use Topics  . Smoking status: Current Every Day Smoker    Packs/day: 1.00    Types: Cigarettes  . Smokeless tobacco: Never Used  . Alcohol use No     Allergies   Diphenhydramine hcl   Review of  Systems Review of Systems  Constitutional: Negative for chills and fever.  Respiratory: Negative for cough, chest tightness and shortness of breath.   Cardiovascular: Negative for chest pain, palpitations and leg swelling.  Gastrointestinal: Positive for abdominal pain and nausea. Negative for abdominal distention, diarrhea and vomiting.  Genitourinary: Positive for flank pain. Negative for dysuria, frequency, hematuria and urgency.  Musculoskeletal: Negative for arthralgias, myalgias, neck pain and neck stiffness.  Skin: Negative for rash.  Allergic/Immunologic: Negative for immunocompromised state.  Neurological: Negative for dizziness, weakness, light-headedness, numbness and headaches.  All other systems reviewed and are negative.    Physical Exam Updated Vital Signs BP 117/87 (BP Location: Right Arm)   Pulse 75   Temp 97.5 F (36.4 C) (Oral)   Resp 20   Ht 5\' 9"  (1.753 m)   Wt 72.6 kg   SpO2 100%   BMI 23.63 kg/m   Physical Exam  Constitutional: He appears well-developed and well-nourished. No distress.  HENT:  Head: Normocephalic and atraumatic.  Eyes: Conjunctivae are normal.  Neck: Neck supple.  Cardiovascular: Normal rate, regular rhythm and normal heart sounds.   Pulmonary/Chest: Effort normal. No respiratory distress. He has no wheezes. He has no rales.  Abdominal: Soft. Bowel sounds are normal. He exhibits no distension. There is tenderness. There is no rebound.  RLQ and Right CVA tenderness  Musculoskeletal: He exhibits no edema.  Neurological: He is alert.  Skin: Skin is warm and dry.  Nursing note and vitals reviewed.    ED Treatments / Results  Labs (all labs ordered are listed, but only abnormal results are displayed) Labs Reviewed  CBC WITH DIFFERENTIAL/PLATELET - Abnormal; Notable for the following:       Result Value   WBC 14.5 (*)    Neutro Abs 10.0 (*)    All other components within normal limits  BASIC METABOLIC PANEL - Abnormal; Notable  for the following:    CO2 21 (*)    Glucose, Bld 119 (*)    All other components within normal limits  URINALYSIS, ROUTINE W REFLEX MICROSCOPIC (NOT AT [facility]) - Abnormal; Notable for the following:    Color, Urine BROWN (*)    APPearance CLOUDY (*)    Hgb urine dipstick LARGE (*)    Protein, ur 30 (*)    Leukocytes, UA TRACE (*)    All other components within normal limits  URINE MICROSCOPIC-ADD ON    EKG  EKG Interpretation None       Radiology [facility]  Result Date: 06/02/2016 CLINICAL DATA:  Acute right flank pain today EXAM: RENAL / URINARY TRACT ULTRASOUND COMPLETE COMPARISON:  CT abdomen pelvis of 06/19/2013 FINDINGS: Right Kidney: Length: 11.3 cm. No hydronephrosis is seen. A tiny cyst is noted in the periphery the right mid kidney of 8 mm in diameter. Left Kidney: Length: 11.3 cm. No hydronephrosis is seen. No echogenic foci are noted to indicate calculi. Bladder: Urinary bladder is not well distended and cannot be evaluated. IMPRESSION: No hydronephrosis.  No renal calculi are seen by ultrasound. Electronically Signed   By: Dwyane DeePaul  Barry M.D.   On: 06/02/2016 08:49    Procedures Procedures (including critical care time)  Medications Ordered in ED Medications  fentaNYL (SUBLIMAZE) injection 100 mcg (100 mcg Intravenous Given 06/02/16 0533)  ketorolac (TORADOL) 30 MG/ML injection 30 mg (30 mg Intravenous Given 06/02/16 [id number])     Initial Impression / Assessment and Plan / ED Course  I have reviewed the triage vital signs and the nursing notes.  Pertinent labs & imaging results that were available during my care of the patient were reviewed by me and considered in my medical decision making (see chart for details).  Clinical Course    Pt seen and examined. Pt with acute onset of right flank pain similar to kidney stones. Pt received 100mcg of fentanyl prior to me seeing him. Feels much better. Pt afebrile. Non toxic appearing. Labs show elevated WBC. UA pending. Will get  US to evaluate for hydronephrosis. Records reviewed, show CT scan with tones in the pat.   9:06 AM Pt improved. US negative. Urine does show large hgb. Question passed stone in setting of resolved pain. Doubt appendicitis or another intraabdominal surgical process, given no current pain or tenderness.  Will dc home with percocet in case pain comes back. Flomax. Follow up with urology.  Final Clinical Impressions(s) / ED Diagnoses   Final diagnoses:  Ureteral colic    New Prescriptions New Prescriptions   IBUPROFEN (ADVIL,MOTRIN) 600 MG TABLET    Take 1 tablet (600 mg total) by mouth every 6 (six) hours as needed.   OXYCODONE-ACETAMINOPHEN (PERCOCET) 5-325 MG TABLET    Take 1 tablet by mouth every 4 (four) hours as needed for severe pain.   TAMSULOSIN (FLOMAX) 0.4 MG CAPS CAPSULE    Take  1 capsule (0.4 mg total) by mouth daily.       Jaynie Crumble, PA-C 06/02/16 1324    Azalia Bilis, MD 06/02/16 1536

## 2017-09-10 ENCOUNTER — Other Ambulatory Visit: Payer: Self-pay | Admitting: Physician Assistant

## 2017-09-10 DIAGNOSIS — R1031 Right lower quadrant pain: Secondary | ICD-10-CM

## 2017-09-10 DIAGNOSIS — R1011 Right upper quadrant pain: Secondary | ICD-10-CM

## 2017-09-10 DIAGNOSIS — R1032 Left lower quadrant pain: Secondary | ICD-10-CM

## 2017-09-13 ENCOUNTER — Ambulatory Visit
Admission: RE | Admit: 2017-09-13 | Discharge: 2017-09-13 | Disposition: A | Discharge status: Home / Self Care | Payer: BLUE CROSS/BLUE SHIELD | Source: Ambulatory Visit | Attending: Physician Assistant | Admitting: Physician Assistant

## 2017-09-13 DIAGNOSIS — R1032 Left lower quadrant pain: Secondary | ICD-10-CM

## 2017-09-13 DIAGNOSIS — R1031 Right lower quadrant pain: Secondary | ICD-10-CM

## 2017-09-13 MED ORDER — IOPAMIDOL (ISOVUE-300) INJECTION 61%
100.0000 mL | Freq: Once | INTRAVENOUS | Status: AC | PRN
  Administered 2017-09-13: 100 mL via INTRAVENOUS

## 2018-05-14 ENCOUNTER — Other Ambulatory Visit: Payer: Self-pay

## 2018-05-14 ENCOUNTER — Encounter (HOSPITAL_COMMUNITY): Payer: Self-pay | Admitting: Emergency Medicine

## 2018-05-14 ENCOUNTER — Emergency Department (HOSPITAL_COMMUNITY): Payer: BLUE CROSS/BLUE SHIELD

## 2018-05-14 ENCOUNTER — Emergency Department (HOSPITAL_COMMUNITY)
Admission: EM | Admit: 2018-05-14 | Discharge: 2018-05-15 | Disposition: A | Discharge status: Home / Self Care | Payer: BLUE CROSS/BLUE SHIELD | Attending: Emergency Medicine | Admitting: Emergency Medicine

## 2018-05-14 DIAGNOSIS — F17210 Nicotine dependence, cigarettes, uncomplicated: Secondary | ICD-10-CM | POA: Insufficient documentation

## 2018-05-14 DIAGNOSIS — Z79899 Other long term (current) drug therapy: Secondary | ICD-10-CM | POA: Diagnosis not present

## 2018-05-14 DIAGNOSIS — N23 Unspecified renal colic: Secondary | ICD-10-CM

## 2018-05-14 DIAGNOSIS — R109 Unspecified abdominal pain: Secondary | ICD-10-CM

## 2018-05-14 DIAGNOSIS — R1084 Generalized abdominal pain: Secondary | ICD-10-CM | POA: Insufficient documentation

## 2018-05-14 LAB — URINALYSIS, ROUTINE W REFLEX MICROSCOPIC
Bacteria, UA: NONE SEEN
Bilirubin Urine: NEGATIVE
Glucose, UA: NEGATIVE mg/dL
KETONES UR: 5 mg/dL — AB
NITRITE: NEGATIVE
PROTEIN: 100 mg/dL — AB
Specific Gravity, Urine: 1.024 (ref 1.005–1.030)
pH: 6 (ref 5.0–8.0)

## 2018-05-14 LAB — CBC
HEMATOCRIT: 41.7 % (ref 39.0–52.0)
Hemoglobin: 14 g/dL (ref 13.0–17.0)
MCH: 32.4 pg (ref 26.0–34.0)
MCHC: 33.6 g/dL (ref 30.0–36.0)
MCV: 96.5 fL (ref 78.0–100.0)
Platelets: 303 10*3/uL (ref 150–400)
RBC: 4.32 MIL/uL (ref 4.22–5.81)
RDW: 13.1 % (ref 11.5–15.5)
WBC: 12.1 10*3/uL — ABNORMAL HIGH (ref 4.0–10.5)

## 2018-05-14 LAB — BASIC METABOLIC PANEL
Anion gap: 9 (ref 5–15)
BUN: 10 mg/dL (ref 6–20)
CALCIUM: 9.3 mg/dL (ref 8.9–10.3)
CO2: 27 mmol/L (ref 22–32)
Chloride: 103 mmol/L (ref 98–111)
Creatinine, Ser: 0.94 mg/dL (ref 0.61–1.24)
GFR calc Af Amer: >60 mL/min (ref >60)
GLUCOSE: 96 mg/dL (ref 70–99)
POTASSIUM: 3.7 mmol/L (ref 3.5–5.1)
Sodium: 139 mmol/L (ref 135–145)

## 2018-05-14 NOTE — ED Notes (Signed)
Pt currently using bathroom. 

## 2018-05-14 NOTE — ED Triage Notes (Signed)
Pt reports right sided flank pain and blood in his urine X10 days.

## 2018-05-15 MED ORDER — ONDANSETRON 4 MG PO TBDP: ORAL_TABLET | ORAL | 0 refills | Status: DC

## 2018-05-15 MED ORDER — TAMSULOSIN HCL 0.4 MG PO CAPS
0.8000 mg | ORAL_CAPSULE | Freq: Once | ORAL | Status: AC
  Administered 2018-05-15: 0.8 mg via ORAL
  Filled 2018-05-15: qty 2

## 2018-05-15 MED ORDER — OXYCODONE HCL 5 MG PO TABS: 5.0000 mg | ORAL_TABLET | Freq: Four times a day (QID) | ORAL | 0 refills | Status: DC | PRN

## 2018-05-15 MED ORDER — TAMSULOSIN HCL 0.4 MG PO CAPS: 0.4000 mg | ORAL_CAPSULE | Freq: Two times a day (BID) | ORAL | 0 refills | Status: DC

## 2018-05-15 MED ORDER — MORPHINE SULFATE (PF) 4 MG/ML IV SOLN
4.0000 mg | Freq: Once | INTRAVENOUS | Status: DC
  Filled 2018-05-15: qty 1

## 2018-05-15 NOTE — ED Provider Notes (Signed)
[facility] EMERGENCY DEPARTMENT Provider Note   CSN: [id number] Arrival date & time: 05/14/18  2016     History   Chief Complaint Chief Complaint  Patient presents with  . Flank Pain  . Hematuria    HPI Jack Reid is a 42 y.o. male with a hx of ADHD, depression, anxiety, kidney stones presents to the Emergency Department complaining of gradual, intermittent, progressively worsening right flank pain onset approximately 1 week ago. Associated symptoms include hematuria.  Patient reports that today the pain moved from his right flank down towards his right lower quadrant of his abdomen.  He reports he has had intermittent nausea as well.  No persistent vomiting.  Patient reports a long history of kidney stones stating that he has had more than 5 in the last 10 years.  He reports his last kidney stone was 2 years ago.  He states approximately 3 years ago he saw a urologist at [facility] who diagnosed him with calcium stones.  He reports he is never had to have a procedure due to an obstructed stone.  He denies fever, chills, headache, neck pain, chest pain, shortness of breath, diarrhea, weakness, dizziness, syncope.  Nothing seems to make his pain better or worse.  He has taken ibuprofen without relief.  The history is provided by the patient, medical records and a parent. No language interpreter was used.    Past Medical History:  Diagnosis Date  . ADHD (attention deficit hyperactivity disorder)    sees Dr. Evelene Croon   . Anxiety   . Depression    sees Dr. Milagros Evener   . Kidney stones     Patient Active Problem List   Diagnosis Date Noted  . No diagnosis 12/21/2015  . ANXIETY 10/05/2008  . DEPRESSION 08/03/2008  . NEPHROLITHIASIS, HX OF 08/03/2008    Past Surgical History:  Procedure Laterality Date  . VASECTOMY          Home Medications    Prior to Admission medications   Medication Sig Start Date End Date Taking? Authorizing Provider    amphetamine-dextroamphetamine (ADDERALL) 20 MG tablet Take 20 mg by mouth 4 (four) times daily.    Yes [provider]  ibuprofen (ADVIL,MOTRIN) 600 MG tablet Take 1 tablet (600 mg total) by mouth every 6 (six) hours as needed. Patient taking differently: Take 600-800 mg by mouth every 6 (six) hours as needed for moderate pain.  06/02/16  Yes Kirichenko, Tatyana, PA-C  ondansetron (ZOFRAN ODT) 4 MG disintegrating tablet 4mg  ODT q4 hours prn nausea/vomit 05/15/18   Mariangel Ringley, Dahlia Client, PA-C  oxyCODONE (ROXICODONE) 5 MG immediate release tablet Take 1 tablet (5 mg total) by mouth every 6 (six) hours as needed for severe pain. 05/15/18   Willamae Demby, Dahlia Client, PA-C  tamsulosin (FLOMAX) 0.4 MG CAPS capsule Take 1 capsule (0.4 mg total) by mouth 2 (two) times daily. 05/15/18   Karlisa Gaubert, Dahlia Client, PA-C    Family History Family History  Problem Relation Age of Onset  . Diabetes Father   . Hyperlipidemia Father   . Heart disease Father     Social History Social History   Tobacco Use  . Smoking status: Current Every Day Smoker    Packs/day: 1.00    Types: Cigarettes  . Smokeless tobacco: Never Used  Substance Use Topics  . Alcohol use: No    Alcohol/week: 0.0 standard drinks  . Drug use: No     Allergies   Diphenhydramine hcl   Review of Systems Review  of Systems  Constitutional: Negative for appetite change, diaphoresis, fatigue, fever and unexpected weight change.  HENT: Negative for mouth sores.   Eyes: Negative for visual disturbance.  Respiratory: Negative for cough, chest tightness, shortness of breath and wheezing.   Cardiovascular: Negative for chest pain.  Gastrointestinal: Positive for abdominal pain and nausea. Negative for constipation, diarrhea and vomiting.  Endocrine: Negative for polydipsia, polyphagia and polyuria.  Genitourinary: Positive for flank pain and hematuria. Negative for dysuria, frequency and urgency.  Musculoskeletal: Negative for back pain and  neck stiffness.  Skin: Negative for rash.  Allergic/Immunologic: Negative for immunocompromised state.  Neurological: Negative for syncope, light-headedness and headaches.  Hematological: Does not bruise/bleed easily.  Psychiatric/Behavioral: Negative for sleep disturbance. The patient is not nervous/anxious.      Physical Exam Updated Vital Signs BP 125/80 (BP Location: Right Arm)   Pulse 83   Temp 98.4 F (36.9 C) (Oral)   Resp 16   SpO2 99%   Physical Exam  Constitutional: He appears well-developed and well-nourished. No distress.  Awake, alert, nontoxic appearance  HENT:  Head: Normocephalic and atraumatic.  Mouth/Throat: Oropharynx is clear and moist. No oropharyngeal exudate.  Eyes: Conjunctivae are normal. No scleral icterus.  Neck: Normal range of motion. Neck supple.  Cardiovascular: Normal rate, regular rhythm, normal heart sounds and intact distal pulses.  Pulmonary/Chest: Effort normal and breath sounds normal. No respiratory distress. He has no wheezes.  Equal chest expansion  Abdominal: Soft. Bowel sounds are normal. He exhibits no distension and no mass. There is tenderness (minimal, R sided ). There is CVA tenderness (right). There is no rebound and no guarding.  Musculoskeletal: Normal range of motion. He exhibits no edema.  Neurological: He is alert.  Speech is clear and goal oriented Moves extremities without ataxia  Skin: Skin is warm and dry. No rash noted. He is not diaphoretic.  Psychiatric: He has a normal mood and affect.  Nursing note and vitals reviewed.    ED Treatments / Results  Labs (all labs ordered are listed, but only abnormal results are displayed) Labs Reviewed  URINALYSIS, ROUTINE W REFLEX MICROSCOPIC - Abnormal; Notable for the following components:      Result Value   Color, Urine AMBER (*)    APPearance CLOUDY (*)    Hgb urine dipstick LARGE (*)    Ketones, ur 5 (*)    Protein, ur 100 (*)    Leukocytes, UA TRACE (*)    RBC /  HPF >50 (*)    All other components within normal limits  CBC - Abnormal; Notable for the following components:   WBC 12.1 (*)    All other components within normal limits  BASIC METABOLIC PANEL     Radiology Ct Renal Stone Study  Result Date: 05/15/2018 CLINICAL DATA:  Patient with hematuria.  Right flank pain. EXAM: CT ABDOMEN AND PELVIS WITHOUT CONTRAST TECHNIQUE: Multidetector CT imaging of the abdomen and pelvis was performed following the standard protocol without IV contrast. COMPARISON:  CT abdomen pelvis 09/13/2017. FINDINGS: Lower chest: Normal heart size. Lung bases are clear. No pleural effusion. Hepatobiliary: The liver is normal in size and contour. No focal lesion identified. Gallbladder is unremarkable. Pancreas: Unremarkable Spleen: Unremarkable Adrenals/Urinary Tract: Normal adrenal glands. Mild right pelviectasis. There is an 8 mm obstructing stone within the proximal right ureter (image 33; series 3). No additional renal stones identified. Urinary bladder is unremarkable. Stomach/Bowel: Normal morphology of the stomach. No abnormal bowel wall thickening or evidence for bowel obstruction.  No free fluid or free intraperitoneal air. Vascular/Lymphatic: Normal caliber abdominal aorta. No retroperitoneal lymphadenopathy. Peripheral calcified atherosclerotic plaque. Reproductive: Unremarkable. Other: None. Musculoskeletal: No aggressive or acute appearing osseous lesions. IMPRESSION: There is an 8 mm stone within the proximal ureter resulting in mild right pelviectasis. Electronically Signed   By: Annia Belt M.D.   On: 05/15/2018 00:03    Procedures Procedures (including critical care time)  Medications Ordered in ED Medications  morphine 4 MG/ML injection 4 mg (4 mg Intravenous Refused 05/15/18 0034)  tamsulosin (FLOMAX) capsule 0.8 mg (0.8 mg Oral Given 05/15/18 0033)     Initial Impression / Assessment and Plan / ED Course  I have reviewed the triage vital signs and the nursing  notes.  Pertinent labs & imaging results that were available during my care of the patient were reviewed by me and considered in my medical decision making (see chart for details).  Clinical Course as of May 16 55  Wynelle Link May 15, 2018  0045 normal  Creatinine: 0.94 [HM]  0046 Slightly elevated  WBC(!): 12.1 [HM]  0046 Hemoglobin but no evidence of infection  Hgb urine dipstick(!): LARGE [HM]  0046 There is an 8 mm stone within the proximal ureter resulting in mild hydronephrosis.  I personally evaluated these images  CT Renal Stone Study [HM]  0047 Pt afebrile without persistent tachycardia.  No hypotension.  No evidence of sepsis.  Temp: 98.4 F (36.9 C) [HM]  0048 Pain well controlled.  No emesis here in the department.  Tolerating PO.   [HM]    Clinical Course User Index [HM] Farzana Koci, Dahlia Client, PA-C    Pt has been diagnosed with a Kidney Stone via CT. There is no evidence of significant hydronephrosis, serum creatine WNL, vitals sign stable and the pt does not have irratractable vomiting.  No evidence of urinary tract infection.  CT scan shows large stone at 8 mm.  I personally evaluated these images.  Pt will be dc home with pain medications and Flomax.  Pt has been advised to follow up with urology as the stone may not pass.  Also discussed reasons to return immediately to the emergency department.  Patient and mother state understanding and are in agreement with the plan..    Final Clinical Impressions(s) / ED Diagnoses   Final diagnoses:  Ureteral colic  Right flank pain    ED Discharge Orders         Ordered    oxyCODONE (ROXICODONE) 5 MG immediate release tablet  Every 6 hours PRN     05/15/18 0045    tamsulosin (FLOMAX) 0.4 MG CAPS capsule  2 times daily     05/15/18 0045    ondansetron (ZOFRAN ODT) 4 MG disintegrating tablet     05/15/18 0045           Layali Freund, [place], PA-C 05/15/18 0056    Nira Conn, MD 05/15/18 [phone]

## 2018-05-15 NOTE — Discharge Instructions (Addendum)
1. Medications: Roxicodone, Flomax, Zofran, usual home medications 2. Treatment: rest, drink plenty of fluids,  3. Follow Up: Please followup with urology in 2-3 days for discussion of your diagnoses and further evaluation after today's visit; if you do not have a primary care doctor use the resource guide provided to find one; Please return to the ER for worsening pain, high fevers or persistent vomiting.

## 2018-05-26 ENCOUNTER — Other Ambulatory Visit: Payer: Self-pay

## 2018-05-26 ENCOUNTER — Other Ambulatory Visit: Payer: Self-pay | Admitting: Urology

## 2018-05-26 ENCOUNTER — Encounter (HOSPITAL_COMMUNITY): Payer: Self-pay | Admitting: *Deleted

## 2018-05-27 ENCOUNTER — Encounter (HOSPITAL_COMMUNITY)
Admission: RE | Disposition: A | Discharge status: Home / Self Care | Payer: Self-pay | Source: Ambulatory Visit | Attending: Urology

## 2018-05-27 ENCOUNTER — Ambulatory Visit (HOSPITAL_COMMUNITY): Payer: BLUE CROSS/BLUE SHIELD | Admitting: Certified Registered"

## 2018-05-27 ENCOUNTER — Other Ambulatory Visit: Payer: Self-pay

## 2018-05-27 ENCOUNTER — Encounter (HOSPITAL_COMMUNITY): Payer: Self-pay | Admitting: *Deleted

## 2018-05-27 ENCOUNTER — Ambulatory Visit (HOSPITAL_COMMUNITY)
Admission: RE | Admit: 2018-05-27 | Discharge: 2018-05-27 | Disposition: A | Discharge status: Home / Self Care | Payer: BLUE CROSS/BLUE SHIELD | Source: Ambulatory Visit | Attending: Urology | Admitting: Urology

## 2018-05-27 ENCOUNTER — Ambulatory Visit (HOSPITAL_COMMUNITY): Payer: BLUE CROSS/BLUE SHIELD

## 2018-05-27 DIAGNOSIS — N201 Calculus of ureter: Secondary | ICD-10-CM | POA: Diagnosis not present

## 2018-05-27 DIAGNOSIS — Z79899 Other long term (current) drug therapy: Secondary | ICD-10-CM | POA: Insufficient documentation

## 2018-05-27 DIAGNOSIS — F419 Anxiety disorder, unspecified: Secondary | ICD-10-CM | POA: Insufficient documentation

## 2018-05-27 DIAGNOSIS — Z87442 Personal history of urinary calculi: Secondary | ICD-10-CM | POA: Diagnosis not present

## 2018-05-27 DIAGNOSIS — K219 Gastro-esophageal reflux disease without esophagitis: Secondary | ICD-10-CM | POA: Diagnosis not present

## 2018-05-27 DIAGNOSIS — F329 Major depressive disorder, single episode, unspecified: Secondary | ICD-10-CM | POA: Diagnosis not present

## 2018-05-27 DIAGNOSIS — F17210 Nicotine dependence, cigarettes, uncomplicated: Secondary | ICD-10-CM | POA: Insufficient documentation

## 2018-05-27 DIAGNOSIS — Z888 Allergy status to other drugs, medicaments and biological substances status: Secondary | ICD-10-CM | POA: Diagnosis not present

## 2018-05-27 DIAGNOSIS — R319 Hematuria, unspecified: Secondary | ICD-10-CM | POA: Insufficient documentation

## 2018-05-27 HISTORY — PX: CYSTOSCOPY/URETEROSCOPY/HOLMIUM LASER/STENT PLACEMENT: SHX6546

## 2018-05-27 HISTORY — DX: Gastro-esophageal reflux disease without esophagitis: K21.9

## 2018-05-27 SURGERY — CYSTOSCOPY/URETEROSCOPY/HOLMIUM LASER/STENT PLACEMENT
Anesthesia: General | Site: Ureter | Laterality: Right

## 2018-05-27 MED ORDER — TRAMADOL HCL 50 MG PO TABS
ORAL_TABLET | ORAL | Status: AC
  Filled 2018-05-27: qty 1

## 2018-05-27 MED ORDER — FENTANYL CITRATE (PF) 100 MCG/2ML IJ SOLN
INTRAMUSCULAR | Status: AC
  Filled 2018-05-27: qty 2

## 2018-05-27 MED ORDER — PROPOFOL 10 MG/ML IV BOLUS
INTRAVENOUS | Status: DC | PRN
  Administered 2018-05-27: 100 mg via INTRAVENOUS
  Administered 2018-05-27: 200 mg via INTRAVENOUS
  Administered 2018-05-27: 50 mg via INTRAVENOUS

## 2018-05-27 MED ORDER — FENTANYL CITRATE (PF) 100 MCG/2ML IJ SOLN: 25.0000 ug | INTRAMUSCULAR | Status: DC | PRN

## 2018-05-27 MED ORDER — SODIUM CHLORIDE 0.9 % IV SOLN
INTRAVENOUS | Status: DC | PRN
  Administered 2018-05-27: 14 mL

## 2018-05-27 MED ORDER — MEPERIDINE HCL 50 MG/ML IJ SOLN: 6.2500 mg | INTRAMUSCULAR | Status: DC | PRN

## 2018-05-27 MED ORDER — LIDOCAINE HCL URETHRAL/MUCOSAL 2 % EX GEL
CUTANEOUS | Status: DC | PRN
  Administered 2018-05-27: 1 via URETHRAL

## 2018-05-27 MED ORDER — LIDOCAINE HCL URETHRAL/MUCOSAL 2 % EX GEL
CUTANEOUS | Status: AC
  Filled 2018-05-27: qty 5

## 2018-05-27 MED ORDER — PROPOFOL 10 MG/ML IV BOLUS
INTRAVENOUS | Status: AC
  Filled 2018-05-27: qty 20

## 2018-05-27 MED ORDER — PROMETHAZINE HCL 25 MG/ML IJ SOLN
INTRAMUSCULAR | Status: AC
  Filled 2018-05-27: qty 1

## 2018-05-27 MED ORDER — CEFAZOLIN SODIUM-DEXTROSE 2-4 GM/100ML-% IV SOLN
2.0000 g | INTRAVENOUS | Status: AC
  Administered 2018-05-27: 2 g via INTRAVENOUS
  Filled 2018-05-27: qty 100

## 2018-05-27 MED ORDER — SODIUM CHLORIDE 0.9 % IR SOLN
Status: DC | PRN
  Administered 2018-05-27: 3000 mL

## 2018-05-27 MED ORDER — OXYCODONE HCL 5 MG PO TABS
5.0000 mg | ORAL_TABLET | Freq: Once | ORAL | Status: AC | PRN
  Administered 2018-05-27: 5 mg via ORAL

## 2018-05-27 MED ORDER — MIDAZOLAM HCL 2 MG/2ML IJ SOLN
INTRAMUSCULAR | Status: AC
  Filled 2018-05-27: qty 2

## 2018-05-27 MED ORDER — OXYCODONE HCL 5 MG PO TABS
ORAL_TABLET | ORAL | Status: AC
  Filled 2018-05-27: qty 1

## 2018-05-27 MED ORDER — SODIUM CHLORIDE 0.9 % IR SOLN
Status: DC | PRN
  Administered 2018-05-27: 1000 mL

## 2018-05-27 MED ORDER — TRAMADOL HCL 50 MG PO TABS: 50.0000 mg | ORAL_TABLET | Freq: Four times a day (QID) | ORAL | 0 refills | Status: DC | PRN

## 2018-05-27 MED ORDER — PROMETHAZINE HCL 25 MG/ML IJ SOLN: 6.2500 mg | INTRAMUSCULAR | Status: DC | PRN

## 2018-05-27 MED ORDER — BELLADONNA ALKALOIDS-OPIUM 16.2-60 MG RE SUPP
RECTAL | Status: DC | PRN
  Administered 2018-05-27: 1 via RECTAL

## 2018-05-27 MED ORDER — CIPROFLOXACIN HCL 500 MG PO TABS: 500.0000 mg | ORAL_TABLET | Freq: Once | ORAL | 0 refills | Status: AC

## 2018-05-27 MED ORDER — LIDOCAINE 2% (20 MG/ML) 5 ML SYRINGE
INTRAMUSCULAR | Status: AC
  Filled 2018-05-27: qty 5

## 2018-05-27 MED ORDER — FENTANYL CITRATE (PF) 100 MCG/2ML IJ SOLN
INTRAMUSCULAR | Status: DC | PRN
  Administered 2018-05-27: 100 ug via INTRAVENOUS
  Administered 2018-05-27 (×4): 50 ug via INTRAVENOUS

## 2018-05-27 MED ORDER — LIDOCAINE HCL (CARDIAC) PF 50 MG/5ML IV SOSY
PREFILLED_SYRINGE | INTRAVENOUS | Status: DC | PRN
  Administered 2018-05-27: 100 mg via INTRAVENOUS

## 2018-05-27 MED ORDER — ONDANSETRON HCL 4 MG/2ML IJ SOLN
INTRAMUSCULAR | Status: DC | PRN
  Administered 2018-05-27: 4 mg via INTRAVENOUS

## 2018-05-27 MED ORDER — MIDAZOLAM HCL 2 MG/2ML IJ SOLN: 0.5000 mg | Freq: Once | INTRAMUSCULAR | Status: DC | PRN

## 2018-05-27 MED ORDER — BELLADONNA ALKALOIDS-OPIUM 16.2-30 MG RE SUPP
RECTAL | Status: AC
  Filled 2018-05-27: qty 1

## 2018-05-27 MED ORDER — MIDAZOLAM HCL 5 MG/5ML IJ SOLN
INTRAMUSCULAR | Status: DC | PRN
  Administered 2018-05-27: 2 mg via INTRAVENOUS

## 2018-05-27 MED ORDER — PHENAZOPYRIDINE HCL 200 MG PO TABS: 200.0000 mg | ORAL_TABLET | Freq: Three times a day (TID) | ORAL | 0 refills | Status: DC | PRN

## 2018-05-27 MED ORDER — TAMSULOSIN HCL 0.4 MG PO CAPS: 0.4000 mg | ORAL_CAPSULE | Freq: Every day | ORAL | 0 refills | Status: AC

## 2018-05-27 MED ORDER — TRAMADOL HCL 50 MG PO TABS
50.0000 mg | ORAL_TABLET | Freq: Four times a day (QID) | ORAL | Status: DC | PRN
  Administered 2018-05-27: 50 mg via ORAL

## 2018-05-27 MED ORDER — PHENAZOPYRIDINE HCL 200 MG PO TABS
ORAL_TABLET | ORAL | Status: AC
  Filled 2018-05-27: qty 1

## 2018-05-27 MED ORDER — PHENAZOPYRIDINE HCL 200 MG PO TABS
200.0000 mg | ORAL_TABLET | Freq: Three times a day (TID) | ORAL | Status: DC | PRN
  Administered 2018-05-27: 200 mg via ORAL

## 2018-05-27 MED ORDER — LACTATED RINGERS IV SOLN
INTRAVENOUS | Status: DC
  Administered 2018-05-27 (×2): via INTRAVENOUS

## 2018-05-27 SURGICAL SUPPLY — 21 items
BAG URO CATCHER STRL LF (MISCELLANEOUS) ×3 IMPLANT
BENZOIN TINCTURE PRP APPL 2/3 (GAUZE/BANDAGES/DRESSINGS) ×3 IMPLANT
CATH URET 5FR 28IN OPEN ENDED (CATHETERS) ×3 IMPLANT
CLOTH BEACON ORANGE TIMEOUT ST (SAFETY) ×3 IMPLANT
DRSG TEGADERM 2-3/8X2-3/4 SM (GAUZE/BANDAGES/DRESSINGS) ×3 IMPLANT
EXTRACTOR STONE 1.7FRX115CM (UROLOGICAL SUPPLIES) ×3 IMPLANT
FIBER LASER TRAC TIP (UROLOGICAL SUPPLIES) ×3 IMPLANT
GLOVE BIOGEL M STRL SZ7.5 (GLOVE) ×3 IMPLANT
GLOVE BIOGEL PI IND STRL 8 (GLOVE) ×1 IMPLANT
GLOVE BIOGEL PI INDICATOR 8 (GLOVE) ×2
GLOVE ECLIPSE 7.5 STRL STRAW (GLOVE) ×3 IMPLANT
GOWN STRL REUS W/ TWL XL LVL3 (GOWN DISPOSABLE) ×1 IMPLANT
GOWN STRL REUS W/TWL XL LVL3 (GOWN DISPOSABLE) ×5 IMPLANT
GUIDEWIRE ANG ZIPWIRE 038X150 (WIRE) ×3 IMPLANT
GUIDEWIRE STR DUAL SENSOR (WIRE) ×6 IMPLANT
MANIFOLD NEPTUNE II (INSTRUMENTS) ×3 IMPLANT
PACK CYSTO (CUSTOM PROCEDURE TRAY) ×3 IMPLANT
STENT URET 6FRX26 CONTOUR (STENTS) ×3 IMPLANT
TUBING CONNECTING 10 (TUBING) ×2 IMPLANT
TUBING CONNECTING 10' (TUBING) ×1
TUBING UROLOGY SET (TUBING) ×3 IMPLANT

## 2018-05-27 NOTE — Pre-Procedure Instructions (Signed)
I have kidney stones.  HPI: Jack Reid is a 42 year-old male established patient who is here for renal calculi.  05/17/18: Patient with prior history of renal stones. He presented to the ED on 8/31 with complaints of right flank pain onset approximately 1 week ago. Associated symptoms include hematuria and nausea. CT imaging reveal approximatly 5 X 8 mm right proximal ureteral calculus (approximatly 1100 HU) with mild hydronephrosis noted. Creatinine was noted to be 0.94. He was started on MET. Today, he denies seeing a stone pass in the interim. He denies any current flank pain or abdominal pain. He states his last episode of pain was yesterday morning. He has been using Oxycodone on PRN basis, which is beneficial for pain management. He did have hematuria initially, but states urine has cleared over the past 2-3 days. He denies dysuria, but does have some increased urgency or frequency. He denies fever or chills. Patient reports a long history of kidney stones stating that he has had more than 5 in the last 10 years. He reports his last kidney stone was 2 years ago.   05/24/18: He returns today for follow up. He denies seeing a stone pass in the interim. He continues to complain of bothersome back pain and groin pain. He currently complains of significant right groin pain. He used Ibuprofen last night for pain management. He has not used anything today. He complains of increased frequency and intermittency of his urinary stream. He has some discomfort associated with voiding, as well. He denies nausea, vomiting, or fevers.   The problem is on the right side. He first stated noticing pain on approximately 05/10/2018. This is not his first kidney stone. He is currently having back pain and groin pain. He denies having flank pain, nausea, vomiting, fever, and chills. He has not caught a stone in his urine strainer since his symptoms began.   He has never had surgical treatment for calculi in the past.      ALLERGIES: Benadryl TABS    MEDICATIONS: Tamsulosin Hcl 0.4 mg capsule  Adderall  Oxycodone Hcl 5 mg tablet     GU PSH: Vasectomy - 2011      PSH Notes: Surgery Of Male Genitalia Vasectomy   NON-GU PSH: None   GU PMH: Ureteral calculus - 06/09/2016, Distal Ureteral Stone On The Right, - 2014 Abdominal Pain Unspec, Abdominal pain - 2014 History of urolithiasis, Nephrolithiasis - 2014      PMH Notes:  1898-09-14 00:00:00 - Note: Normal Routine History And Physical Adult  2010-07-15 08:28:40 - Note: Bipolar Disorder   NON-GU PMH: Anxiety, Anxiety (Symptom) - 2014 Cardiac murmur, unspecified, Murmurs - 2014 Personal history of other mental and behavioral disorders, History of depression - 2014    FAMILY HISTORY: renal failure - Brother   SOCIAL HISTORY: Marital Status: Single Preferred Language: English; Ethnicity: Not Hispanic Or Latino; Race: White Current Smoking Status: Patient smokes. Smokes 1 pack per day.  Has never drank.  Drinks 4+ caffeinated drinks per day. 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     Notes: Alcohol Use, Tobacco Use, Caffeine Use, Marital History - Currently Married   REVIEW OF SYSTEMS:    GU Review Male:   Patient denies frequent urination, hard to postpone urination, burning/ pain with urination, get up at night to urinate, leakage of urine, stream starts and stops, trouble starting your stream, have to strain to urinate , erection problems, and penile pain.  Gastrointestinal (Upper):   Patient denies nausea, vomiting, and indigestion/   heartburn.  Gastrointestinal (Lower):   Patient denies diarrhea and constipation.  Constitutional:   Patient denies fever, night sweats, weight loss, and fatigue.  Skin:   Patient denies skin rash/ lesion and itching.  Eyes:   Patient denies blurred vision and double vision.  Ears/ Nose/ Throat:   Patient denies sore throat and sinus problems.  Hematologic/Lymphatic:   Patient denies swollen glands and easy  bruising.  Cardiovascular:   Patient denies leg swelling and chest pains.  Respiratory:   Patient denies cough and shortness of breath.  Endocrine:   Patient denies excessive thirst.  Musculoskeletal:   Patient reports back pain. Patient denies joint pain.  Neurological:   Patient denies headaches and dizziness.  Psychologic:   Patient denies depression and anxiety.   Notes: Groin Pain     VITAL SIGNS:      05/24/2018 03:14 PM  Weight 170 lb / 77.11 kg  Height 69 in / 175.26 cm  BP 145/84 mmHg  Pulse 88 /min  Temperature 98.0 F / 36.6 C  BMI 25.1 kg/m²   MULTI-SYSTEM PHYSICAL EXAMINATION:    Constitutional: Well-nourished. No physical deformities. Normally developed. Good grooming.  Respiratory: No labored breathing, no use of accessory muscles.   Cardiovascular: Normal temperature, normal extremity pulses, no swelling, no varicosities.  Skin: No paleness, no jaundice, no cyanosis. No lesion, no ulcer, no rash.  Neurologic / Psychiatric: Oriented to time, oriented to place, oriented to person. No depression, no anxiety, no agitation.  Gastrointestinal: No mass, no tenderness, no rigidity, non obese abdomen. Mild right CVAT.   Musculoskeletal: Normal gait and station of head and neck.     PAST DATA REVIEWED:  Source Of History:  Patient  Records Review:   Previous Patient Records  Urine Test Review:   Urinalysis, Urine Culture  X-Ray Review: [facility] Protocol: Reviewed Films. Reviewed Report.     PROCEDURES:         KUB - 74018  A single view of the abdomen is obtained. No obvious opacities noted within the confines of bilateral renal shadows or along the expected anatomical course of the left ureter. There continues to be an approximatly 4 X 7 mm opacity along the course of the proximal right ureter. This appears to have progressed only minimally when compared with prior imaging study. Prominent overlying bowel gas pattern. Stable pelvic calcifications.                 Urinalysis Dipstick Dipstick Cont'd  Color: Yellow Bilirubin: Neg mg/dL  Appearance: Clear Ketones: Neg mg/dL  Specific Gravity: 1.015 Blood: Neg ery/uL  pH: 7.0 Protein: Neg mg/dL  Glucose: Neg mg/dL Urobilinogen: 0.2 mg/dL    Nitrites: Neg    Leukocyte Esterase: Neg leu/uL         Ketoralac 60mg - 96372, J1885 Qty: 60 Adm. By: Shanita Tillman  Unit: mg Lot No [id number]  Route: IM Exp. Date 07/16/2019  Freq: None Mfgr.:   Site: Left Buttock   ASSESSMENT:      ICD-10 Details  1 GU:   Renal calculus - N20.0    PLAN:            Medications New Meds: Oxycodone Hcl 5 mg tablet 1 tablet PO Q 6 H PRN   #16  0 Refill(s)            Orders Labs Urine Culture          Schedule Procedure: 05/24/2018 at [facility] - [id number] -   Ketoralac 60mg (Toradol Per 15 Mg) - J1885, 96372          Document Letter(s):  Created for Patient: Clinical Summary         Notes:   Urinalysis today is without infectious parameters. I will send for culture. On KUB imaging today, there continues to be an approximatly 4 X 7 mm opacity along the course of the proximal right ureter. This appears to have progressed only minimally when compared with prior imaging study. Treatment options reviewed in detail. He is most interested in management with ureteroscopy. For ureteroscopy I described the risks which include general anesthetic complications, bleeding, infection, damage to contiguous structures, positioning injury, ureteral  stricture, ureteral avulsion, ureteral injury, need for ureteral stent, inability to perform ureteroscopy, need for an interval procedure, inability to clear stone burden, stent discomfort and pain. I will review imaging studies with his urologist and post accordingly. I did provide Rx for additional narcotic pain medication today for severe pain. We reviewed indications and potential side effects. I encouraged that he continue to hydrate well. Strict return precuations  reviewed for fever or progressive pain, nausea, or vomiting. He voiced understanding.     

## 2018-05-27 NOTE — Anesthesia Preprocedure Evaluation (Addendum)
Anesthesia Evaluation  Patient identified by MRN, date of birth, ID band Patient awake    Reviewed: Allergy & Precautions, NPO status , Patient's Chart, lab work & pertinent test results  History of Anesthesia Complications Negative for: history of anesthetic complications  Airway Mallampati: II  TM Distance: >3 FB Neck ROM: Full    Dental  (+) Dental Advisory Given   Pulmonary Current Smoker,    breath sounds clear to auscultation       Cardiovascular negative cardio ROS   Rhythm:Regular Rate:Normal     Neuro/Psych PSYCHIATRIC DISORDERS (ADHD) Anxiety Depression negative neurological ROS     GI/Hepatic Neg liver ROS, GERD  Medicated and Controlled,  Endo/Other  negative endocrine ROS  Renal/GU stones     Musculoskeletal negative musculoskeletal ROS (+)   Abdominal   Peds  Hematology negative hematology ROS (+)   Anesthesia Other Findings   Reproductive/Obstetrics                            Anesthesia Physical Anesthesia Plan  ASA: II  Anesthesia Plan: General   Post-op Pain Management:    Induction: Intravenous  PONV Risk Score and Plan: 1 and Ondansetron and Dexamethasone  Airway Management Planned: LMA  Additional Equipment:   Intra-op Plan:   Post-operative Plan:   Informed Consent: I have reviewed the patients History and Physical, chart, labs and discussed the procedure including the risks, benefits and alternatives for the proposed anesthesia with the patient or authorized representative who has indicated his/her understanding and acceptance.   Dental advisory given  Plan Discussed with: CRNA and Surgeon  Anesthesia Plan Comments: (Plan routine monitors, GETA)        Anesthesia Quick Evaluation

## 2018-05-27 NOTE — Discharge Instructions (Signed)
DISCHARGE INSTRUCTIONS FOR KIDNEY STONE/URETERAL STENT   MEDICATIONS:  1.  Resume all your other meds from home - except do not take any extra narcotic pain meds that you may have at home.  2. Pyridium is to help with the burning/stinging when you urinate. 3. Tramadol is for moderate/severe pain, otherwise taking upto 1000 mg every 6 hours of plainTylenol will help treat your pain.   4. Take Cipro one hour prior to removal of your stent.   ACTIVITY:  1. No strenuous activity x 1week  2. No driving while on narcotic pain medications  3. Drink plenty of water  4. Continue to walk at home - you can still get blood clots when you are at home, so keep active, but don't over do it.  5. May return to work/school tomorrow or when you feel ready   BATHING:  1. You can shower and we recommend daily showers  2. You have a string coming from your urethra: The stent string is attached to your ureteral stent. Do not pull on this.   SIGNS/SYMPTOMS TO CALL:  Please call us if you have a fever greater than 101.5, uncontrolled nausea/vomiting, uncontrolled pain, dizziness, unable to urinate, bloody urine, chest pain, shortness of breath, leg swelling, leg pain, redness around wound, drainage from wound, or any other concerns or questions.   You can reach us at [phone].   FOLLOW-UP:  1. You have an appointment in 6 weeks with a ultrasound of your kidneys prior.   2. You have a string attached to your stent, you may remove it on Sept 18th. To do this, pull the strings until the stents are completely removed. You may feel an odd sensation in your back.

## 2018-05-27 NOTE — Progress Notes (Signed)
Called Dr Elon SpannerHerricks office for Orders for scheduled surgery today.

## 2018-05-27 NOTE — Transfer of Care (Signed)
Immediate Anesthesia Transfer of Care Note  Patient: Jack BryantMichael Locurto  Procedure(s) Performed: CYSTOSCOPY RIGHT  RETROGRADE RIGHT URETEROSCOPY/HOLMIUM LASER/ RIGHT URETERAL STENT PLACEMENT (Right Ureter)  Patient Location: PACU  Anesthesia Type:General  Level of Consciousness: awake, alert , oriented and patient cooperative  Airway & Oxygen Therapy: Patient Spontanous Breathing and Patient connected to face mask oxygen  Post-op Assessment: stable  Post vital signs: stable  Last Vitals:  Vitals Value Taken Time  BP 138/95 05/27/2018  5:54 PM  Temp    Pulse 82 05/27/2018  5:56 PM  Resp 15 05/27/2018  5:56 PM  SpO2 100 % 05/27/2018  5:56 PM  Vitals shown include unvalidated device data.  Last Pain:  Vitals:   05/27/18 1401  TempSrc: Oral  PainSc: 3       Patients Stated Pain Goal: 2 (05/27/18 1401)  Complications: No apparent anesthesia complications

## 2018-05-27 NOTE — Anesthesia Postprocedure Evaluation (Signed)
Anesthesia Post Note  Patient: Jack Reid  Procedure(s) Performed: CYSTOSCOPY RIGHT  RETROGRADE RIGHT URETEROSCOPY/HOLMIUM LASER/ RIGHT URETERAL STENT PLACEMENT (Right Ureter)     Patient location during evaluation: PACU Anesthesia Type: General Level of consciousness: awake and alert, oriented and patient cooperative Pain management: pain level controlled Vital Signs Assessment: post-procedure vital signs reviewed and stable Respiratory status: spontaneous breathing, nonlabored ventilation and respiratory function stable Cardiovascular status: blood pressure returned to baseline and stable Postop Assessment: no apparent nausea or vomiting Anesthetic complications: no    Last Vitals:  Vitals:   05/27/18 1815 05/27/18 1835  BP: 131/87 109/74  Pulse: 73 82  Resp: 15 18  Temp:  36.5 C  SpO2: 95% 98%    Last Pain:  Vitals:   05/27/18 1815  TempSrc:   PainSc: 5                  Zeeshan Korte,E. Natilee Gauer

## 2018-05-27 NOTE — Op Note (Signed)
Preoperative diagnosis: right ureteral calculus  Postoperative diagnosis: right ureteral calculus  Procedure:  1. Cystoscopy 2. right ureteroscopy and stone removal 3. Ureteroscopic laser lithotripsy 4. right 40F x 26 ureteral stent placement  5. right retrograde pyelography with interpretation  Surgeon: Crist Fat, MD  Anesthesia: General  Complications: None  Intraoperative findings: right retrograde pyelography demonstrated a filling defect within the right ureter consistent with the patient's known calculus without other abnormalities.  EBL: Minimal  Specimens: 1. right ureteral calculus  Disposition of specimens: [facility] for stone analysis  Indication: Jack Reid is a 42 y.o.   patient with a right ureteral stone and associated right symptoms. After reviewing the management options for treatment, the patient elected to proceed with the above surgical procedure(s). We have discussed the potential benefits and risks of the procedure, side effects of the proposed treatment, the likelihood of the patient achieving the goals of the procedure, and any potential problems that might occur during the procedure or recuperation. Informed consent has been obtained.   Description of procedure:  The patient was taken to the operating room and general anesthesia was induced.  The patient was placed in the dorsal lithotomy position, prepped and draped in the usual sterile fashion, and preoperative antibiotics were administered. A preoperative time-out was performed.   Cystourethroscopy was performed.  The patient's urethra was examined and was normal, demonstrated bilobar prostatic hypertrophy. The bladder was then systematically examined in its entirety. There was no evidence for any bladder tumors, stones, or other mucosal pathology.    Attention then turned to the right ureteral orifice and a ureteral catheter was used to intubate the ureteral orifice.   Omnipaque contrast was injected through the ureteral catheter and a retrograde pyelogram was performed with findings as dictated above.  A 0.38 sensor guidewire was then advanced up the right ureter into the renal pelvis under fluoroscopic guidance. The 6 Fr semirigid ureteroscope was then advanced into the ureter next to the guidewire and up to the renal pelvis, no stone was identified.  I then advanced a second wire through the scope and into the right renal pelvis.  I then slowly backed out the scope over the wire.  I then advanced a flexible ureteroscope over the second wire and was able to advance it into the renal pelvis.  Pyeloscopy confirmed a stone in the pelvis.  I then used a basket and pulled it to the proximal ureter and lasered into several smaller pieces.  I then remove the stone fragments in their entirety.  I repeated pyeloscopy and no significant stone fragments were remaining.  All stones were then removed from the ureter with an N-gage nitinol basket.  Reinspection of the ureter revealed no remaining visible stones or fragments.   The wire was then backloaded through the cystoscope and a ureteral stent was advance over the wire using Seldinger technique.  The stent was positioned appropriately under fluoroscopic and cystoscopic guidance.  The wire was then removed with an adequate stent curl noted in the renal pelvis as well as in the bladder.  The bladder was then emptied and the procedure ended.  The patient appeared to tolerate the procedure well and without complications.  The patient was able to be awakened and transferred to the recovery unit in satisfactory condition.   Disposition: The tether of the stent was left on and secured to the ventral aspect of the patient's penis. Instructions for removing the stent have been provided to the patient. The  patient has been scheduled for followup in 6 weeks with a renal ultrasound.

## 2018-05-27 NOTE — Anesthesia Procedure Notes (Signed)
Procedure Name: LMA Insertion Date/Time: 05/27/2018 4:46 PM Performed by: Illene SilverEvans, Elesa Garman E, CRNA Pre-anesthesia Checklist: Patient identified, Emergency Drugs available, Suction available and Patient being monitored Patient Re-evaluated:Patient Re-evaluated prior to induction Oxygen Delivery Method: Circle system utilized Preoxygenation: Pre-oxygenation with 100% oxygen Induction Type: IV induction Ventilation: Mask ventilation without difficulty LMA: LMA with gastric port inserted LMA Size: 4.0 Tube type: Oral Number of attempts: 1 Airway Equipment and Method: Oral airway Placement Confirmation: ETT inserted through vocal cords under direct vision,  positive ETCO2 and breath sounds checked- equal and bilateral Tube secured with: Tape Dental Injury: Teeth and Oropharynx as per pre-operative assessment

## 2018-05-28 ENCOUNTER — Encounter (HOSPITAL_COMMUNITY): Payer: Self-pay | Admitting: Urology

## 2018-05-28 NOTE — H&P (Signed)
I have kidney stones.  HPI: Jack Reid is a 42 year-old male established patient who is here for renal calculi.  05/17/18: Patient with prior history of renal stones. He presented to the ED on 8/31 with complaints of right flank pain onset approximately 1 week ago. Associated symptoms include hematuria and nausea. CT imaging reveal approximatly 5 X 8 mm right proximal ureteral calculus (approximatly 1100 HU) with mild hydronephrosis noted. Creatinine was noted to be 0.94. He was started on MET. Today, he denies seeing a stone pass in the interim. He denies any current flank pain or abdominal pain. He states his last episode of pain was yesterday morning. He has been using Oxycodone on PRN basis, which is beneficial for pain management. He did have hematuria initially, but states urine has cleared over the past 2-3 days. He denies dysuria, but does have some increased urgency or frequency. He denies fever or chills. Patient reports a long history of kidney stones stating that he has had more than 5 in the last 10 years. He reports his last kidney stone was 2 years ago.   05/24/18: He returns today for follow up. He denies seeing a stone pass in the interim. He continues to complain of bothersome back pain and groin pain. He currently complains of significant right groin pain. He used Ibuprofen last night for pain management. He has not used anything today. He complains of increased frequency and intermittency of his urinary stream. He has some discomfort associated with voiding, as well. He denies nausea, vomiting, or fevers.   The problem is on the right side. He first stated noticing pain on approximately 05/10/2018. This is not his first kidney stone. He is currently having back pain and groin pain. He denies having flank pain, nausea, vomiting, fever, and chills. He has not caught a stone in his urine strainer since his symptoms began.   He has never had surgical treatment for calculi in the past.      ALLERGIES: Benadryl TABS    MEDICATIONS: Tamsulosin Hcl 0.4 mg capsule  Adderall  Oxycodone Hcl 5 mg tablet     GU PSH: Vasectomy - 2011      PSH Notes: Surgery Of Male Genitalia Vasectomy   NON-GU PSH: None   GU PMH: Ureteral calculus - 06/09/2016, Distal Ureteral Stone On The Right, - 2014 Abdominal Pain Unspec, Abdominal pain - 2014 History of urolithiasis, Nephrolithiasis - 2014      PMH Notes:  1898-09-14 00:00:00 - Note: Normal Routine History And Physical Adult  2010-07-15 08:28:40 - Note: Bipolar Disorder   NON-GU PMH: Anxiety, Anxiety (Symptom) - 2014 Cardiac murmur, unspecified, Murmurs - 2014 Personal history of other mental and behavioral disorders, History of depression - 2014    FAMILY HISTORY: renal failure - Brother   SOCIAL HISTORY: Marital Status: Single Preferred Language: English; Ethnicity: Not Hispanic Or Latino; Race: White Current Smoking Status: Patient smokes. Smokes 1 pack per day.  Has never drank.  Drinks 4+ caffeinated drinks per day. Patient's occupation Training and development officer.     Notes: Alcohol Use, Tobacco Use, Caffeine Use, Marital History - Currently Married   REVIEW OF SYSTEMS:    GU Review Male:   Patient denies frequent urination, hard to postpone urination, burning/ pain with urination, get up at night to urinate, leakage of urine, stream starts and stops, trouble starting your stream, have to strain to urinate , erection problems, and penile pain.  Gastrointestinal (Upper):   Patient denies nausea, vomiting, and indigestion/  heartburn.  Gastrointestinal (Lower):   Patient denies diarrhea and constipation.  Constitutional:   Patient denies fever, night sweats, weight loss, and fatigue.  Skin:   Patient denies skin rash/ lesion and itching.  Eyes:   Patient denies blurred vision and double vision.  Ears/ Nose/ Throat:   Patient denies sore throat and sinus problems.  Hematologic/Lymphatic:   Patient denies swollen glands and easy  bruising.  Cardiovascular:   Patient denies leg swelling and chest pains.  Respiratory:   Patient denies cough and shortness of breath.  Endocrine:   Patient denies excessive thirst.  Musculoskeletal:   Patient reports back pain. Patient denies joint pain.  Neurological:   Patient denies headaches and dizziness.  Psychologic:   Patient denies depression and anxiety.   Notes: Groin Pain     VITAL SIGNS:      05/24/2018 03:14 PM  Weight 170 lb / 77.11 kg  Height 69 in / 175.26 cm  BP 145/84 mmHg  Pulse 88 /min  Temperature 98.0 F / 36.6 C  BMI 25.1 kg/m   MULTI-SYSTEM PHYSICAL EXAMINATION:    Constitutional: Well-nourished. No physical deformities. Normally developed. Good grooming.  Respiratory: No labored breathing, no use of accessory muscles.   Cardiovascular: Normal temperature, normal extremity pulses, no swelling, no varicosities.  Skin: No paleness, no jaundice, no cyanosis. No lesion, no ulcer, no rash.  Neurologic / Psychiatric: Oriented to time, oriented to place, oriented to person. No depression, no anxiety, no agitation.  Gastrointestinal: No mass, no tenderness, no rigidity, non obese abdomen. Mild right CVAT.   Musculoskeletal: Normal gait and station of head and neck.     PAST DATA REVIEWED:  Source Of History:  Patient  Records Review:   Previous Patient Records  Urine Test Review:   Urinalysis, Urine Culture  X-Ray Review: C.T. Stone Protocol: Reviewed Films. Reviewed Report.     PROCEDURES:         KUB - [id number]  A single view of the abdomen is obtained. No obvious opacities noted within the confines of bilateral renal shadows or along the expected anatomical course of the left ureter. There continues to be an approximatly 4 X 7 mm opacity along the course of the proximal right ureter. This appears to have progressed only minimally when compared with prior imaging study. Prominent overlying bowel gas pattern. Stable pelvic calcifications.                 Urinalysis Dipstick Dipstick Cont'd  Color: Yellow Bilirubin: Neg mg/dL  Appearance: Clear Ketones: Neg mg/dL  Specific Gravity: 1.015 Blood: Neg ery/uL  pH: 7.0 Protein: Neg mg/dL  Glucose: Neg mg/dL Urobilinogen: 0.2 mg/dL    Nitrites: Neg    Leukocyte Esterase: Neg leu/uL         Ketoralac 59m - [id number], [id number]: 60 Adm. By: SAldine Contes Unit: mg Lot No [id number] Route: IM Exp. Date 07/16/2019  Freq: None Mfgr.:   Site: Left Buttock   ASSESSMENT:      ICD-10 Details  1 GU:   Renal calculus - N20.0    PLAN:            Medications New Meds: Oxycodone Hcl 5 mg tablet 1 tablet PO Q 6 H PRN   #16  0 Refill(s)            Orders Labs Urine Culture          Schedule Procedure: [facility] - [phone]-  Ketoralac 29m (Toradol Per 15 Mg) - [id number]         Document Letter(s):  Created for Patient: Clinical Summary         Notes:   Urinalysis today is without infectious parameters. I will send for culture. On KUB imaging today, there continues to be an approximatly 4 X 7 mm opacity along the course of the proximal right ureter. This appears to have progressed only minimally when compared with prior imaging study. Treatment options reviewed in detail. He is most interested in management with ureteroscopy. For ureteroscopy I described the risks which include general anesthetic complications, bleeding, infection, damage to contiguous structures, positioning injury, ureteral  stricture, ureteral avulsion, ureteral injury, need for ureteral stent, inability to perform ureteroscopy, need for an interval procedure, inability to clear stone burden, stent discomfort and pain. I will review imaging studies with his urologist and post accordingly. I did provide Rx for additional narcotic pain medication today for severe pain. We reviewed indications and potential side effects. I encouraged that he continue to hydrate well. Strict return precuations  reviewed for fever or progressive pain, nausea, or vomiting. He voiced understanding.

## 2018-06-01 ENCOUNTER — Emergency Department (HOSPITAL_COMMUNITY): Payer: BLUE CROSS/BLUE SHIELD

## 2018-06-01 ENCOUNTER — Encounter (HOSPITAL_COMMUNITY): Payer: Self-pay

## 2018-06-01 ENCOUNTER — Emergency Department (HOSPITAL_COMMUNITY)
Admission: EM | Admit: 2018-06-01 | Discharge: 2018-06-01 | Disposition: A | Discharge status: Home / Self Care | Payer: BLUE CROSS/BLUE SHIELD | Attending: Emergency Medicine | Admitting: Emergency Medicine

## 2018-06-01 DIAGNOSIS — N1330 Unspecified hydronephrosis: Secondary | ICD-10-CM | POA: Insufficient documentation

## 2018-06-01 DIAGNOSIS — N23 Unspecified renal colic: Secondary | ICD-10-CM

## 2018-06-01 DIAGNOSIS — F17210 Nicotine dependence, cigarettes, uncomplicated: Secondary | ICD-10-CM | POA: Insufficient documentation

## 2018-06-01 DIAGNOSIS — N134 Hydroureter: Secondary | ICD-10-CM | POA: Diagnosis not present

## 2018-06-01 DIAGNOSIS — G8918 Other acute postprocedural pain: Secondary | ICD-10-CM | POA: Diagnosis present

## 2018-06-01 LAB — BASIC METABOLIC PANEL
Anion gap: 10 (ref 5–15)
BUN: 16 mg/dL (ref 6–20)
CHLORIDE: 102 mmol/L (ref 98–111)
CO2: 25 mmol/L (ref 22–32)
Calcium: 9.8 mg/dL (ref 8.9–10.3)
Creatinine, Ser: 1.26 mg/dL — ABNORMAL HIGH (ref 0.61–1.24)
GFR calc non Af Amer: >60 mL/min (ref >60)
Glucose, Bld: 138 mg/dL — ABNORMAL HIGH (ref 70–99)
POTASSIUM: 4.3 mmol/L (ref 3.5–5.1)
Sodium: 137 mmol/L (ref 135–145)

## 2018-06-01 LAB — CBC WITH DIFFERENTIAL/PLATELET
BASOS ABS: 0 10*3/uL (ref 0.0–0.1)
BASOS PCT: 0 %
Eosinophils Absolute: 0 10*3/uL (ref 0.0–0.7)
Eosinophils Relative: 0 %
HEMATOCRIT: 41.2 % (ref 39.0–52.0)
HEMOGLOBIN: 14.2 g/dL (ref 13.0–17.0)
LYMPHS PCT: 7 %
Lymphs Abs: 1.6 10*3/uL (ref 0.7–4.0)
MCH: 32.1 pg (ref 26.0–34.0)
MCHC: 34.5 g/dL (ref 30.0–36.0)
MCV: 93.2 fL (ref 78.0–100.0)
Monocytes Absolute: 1.8 10*3/uL — ABNORMAL HIGH (ref 0.1–1.0)
Monocytes Relative: 8 %
NEUTROS ABS: 19.1 10*3/uL — AB (ref 1.7–7.7)
NEUTROS PCT: 85 %
Platelets: 374 10*3/uL (ref 150–400)
RBC: 4.42 MIL/uL (ref 4.22–5.81)
RDW: 12.8 % (ref 11.5–15.5)
WBC: 22.4 10*3/uL — AB (ref 4.0–10.5)

## 2018-06-01 LAB — URINALYSIS, ROUTINE W REFLEX MICROSCOPIC
Bilirubin Urine: NEGATIVE
GLUCOSE, UA: NEGATIVE mg/dL
Ketones, ur: NEGATIVE mg/dL
Leukocytes, UA: NEGATIVE
NITRITE: POSITIVE — AB
PH: 8 (ref 5.0–8.0)
PROTEIN: 100 mg/dL — AB
RBC / HPF: >50 RBC/hpf — ABNORMAL HIGH (ref 0–5)
Specific Gravity, Urine: 1.012 (ref 1.005–1.030)

## 2018-06-01 MED ORDER — ONDANSETRON HCL 4 MG/2ML IJ SOLN
4.0000 mg | Freq: Once | INTRAMUSCULAR | Status: AC
  Administered 2018-06-01: 4 mg via INTRAVENOUS
  Filled 2018-06-01: qty 2

## 2018-06-01 MED ORDER — HYDROMORPHONE HCL 1 MG/ML IJ SOLN
1.0000 mg | Freq: Once | INTRAMUSCULAR | Status: AC
  Administered 2018-06-01: 1 mg via INTRAVENOUS
  Filled 2018-06-01: qty 1

## 2018-06-01 MED ORDER — OXYCODONE HCL 5 MG PO CAPS: 5.0000 mg | ORAL_CAPSULE | ORAL | 0 refills | Status: AC | PRN

## 2018-06-01 MED ORDER — ONDANSETRON 8 MG PO TBDP: 8.0000 mg | ORAL_TABLET | Freq: Three times a day (TID) | ORAL | 0 refills | Status: AC | PRN

## 2018-06-01 MED ORDER — KETOROLAC TROMETHAMINE 10 MG PO TABS: ORAL_TABLET | ORAL | 0 refills | Status: AC

## 2018-06-01 MED ORDER — KETOROLAC TROMETHAMINE 15 MG/ML IJ SOLN
15.0000 mg | Freq: Once | INTRAMUSCULAR | Status: AC
  Administered 2018-06-01: 15 mg via INTRAVENOUS
  Filled 2018-06-01: qty 1

## 2018-06-01 NOTE — ED Triage Notes (Signed)
Pt removed the stent in his kidney today and he's having severe pain in his bladder, pt removed the stent about 5pm today as his discharge instruction told him to

## 2018-06-01 NOTE — ED Notes (Signed)
Patient transported to CT 

## 2018-06-01 NOTE — ED Provider Notes (Signed)
[facility]-EMERGENCY DEPT Provider Note: Jack Dell, MD, FACEP  CSN: [id number] MRN: [id number] ARRIVAL: 06/01/18 at 0004 ROOM: RESA/RESA   CHIEF COMPLAINT  Flank Pain   HISTORY OF PRESENT ILLNESS  06/01/18 1:24 AM Jack Reid is a 42 y.o. male who had a right ureteral stone removed by Dr. Marlou Porch on September 13.  A stent was left in place which the patient removed as scheduled about 5:30 PM yesterday evening.  He had no immediate pain but has subsequently developed severe pain in his right lower quadrant radiating to his right flank.  He describes the pain as feeling like he has to urinate.  He also states the pain is severe in the end of his penis.  On arrival he felt like his bladder was distended but a bladder scan only showed about 30 mL of urine.  A Foley catheter was placed by nursing staff with little return of urine.   Past Medical History:  Diagnosis Date  . ADHD (attention deficit hyperactivity disorder)    sees Dr. Evelene Croon   . Anxiety   . Depression    sees Dr. Milagros Evener   . GERD (gastroesophageal reflux disease)   . Kidney stones     Past Surgical History:  Procedure Laterality Date  . CYSTOSCOPY/URETEROSCOPY/HOLMIUM LASER/STENT PLACEMENT Right 05/27/2018   Procedure: CYSTOSCOPY RIGHT  RETROGRADE RIGHT URETEROSCOPY/HOLMIUM LASER/ RIGHT URETERAL STENT PLACEMENT;  Surgeon: Crist Fat, MD;  Location: [facility];  Service: Urology;  Laterality: Right;  Marland Kitchen VASECTOMY      Family History  Problem Relation Age of Onset  . Diabetes Father   . Hyperlipidemia Father   . Heart disease Father     Social History   Tobacco Use  . Smoking status: Current Every Day Smoker    Packs/day: 1.50    Years: 25.00    Pack years: 37.50    Types: Cigarettes  . Smokeless tobacco: Never Used  Substance Use Topics  . Alcohol use: No    Alcohol/week: 0.0 standard drinks  . Drug use: No    Prior to Admission medications   Medication Sig Start Date End Date Taking?  Authorizing Provider  amphetamine-dextroamphetamine (ADDERALL) 20 MG tablet Take 20 mg by mouth 4 (four) times daily.    Yes [provider]  tamsulosin (FLOMAX) 0.4 MG CAPS capsule Take 1 capsule (0.4 mg total) by mouth daily. Take until stent is removed. 05/27/18  Yes Crist Fat, MD  traMADol (ULTRAM) 50 MG tablet Take 1-2 tablets (50-100 mg total) by mouth every 6 (six) hours as needed for moderate pain. 05/27/18  Yes Crist Fat, MD  ondansetron (ZOFRAN ODT) 4 MG disintegrating tablet 4mg  ODT q4 hours prn nausea/vomit Patient not taking: Reported on 06/01/2018 05/15/18   Muthersbaugh, Dahlia Client, PA-C  phenazopyridine (PYRIDIUM) 200 MG tablet Take 1 tablet (200 mg total) by mouth 3 (three) times daily as needed for pain. Patient not taking: Reported on 06/01/2018 05/27/18   Crist Fat, MD    Allergies Diphenhydramine hcl   REVIEW OF SYSTEMS  Negative except as noted here or in the History of Present Illness.   PHYSICAL EXAMINATION  Initial Vital Signs Blood pressure (!) 155/33, pulse 82, temperature 97.6 F (36.4 C), temperature source Oral, resp. rate 20, SpO2 100 %.  Examination General: Well-developed, well-nourished male in no acute distress; appearance consistent with age of record HENT: normocephalic; atraumatic Eyes: pupils equal, round and reactive to light; extraocular muscles intact Neck: supple Heart: regular rate and  rhythm Lungs: clear to auscultation bilaterally Abdomen: soft; nondistended; suprapubic and right lower quadrant tenderness; no masses or hepatosplenomegaly; bowel sounds present Extremities: No deformity; full range of motion; pulses normal Neurologic: Awake, alert and oriented; motor function intact in all extremities and symmetric; no facial droop Skin: Warm and dry Psychiatric: Grimacing   RESULTS  Summary of this visit's results, reviewed by myself:   EKG Interpretation  Date/Time:    Ventricular Rate:    PR  Interval:    QRS Duration:   QT Interval:    QTC Calculation:   R Axis:     Text Interpretation:        Laboratory Studies: Results for orders placed or performed during the hospital encounter of 06/01/18 (from the past 24 hour(s))  CBC with Differential/Platelet     Status: Abnormal   Collection Time: 06/01/18  1:34 AM  Result Value Ref Range   WBC 22.4 (H) 4.0 - 10.5 K/uL   RBC 4.42 4.22 - 5.81 MIL/uL   Hemoglobin 14.2 13.0 - 17.0 g/dL   HCT 40.9 81.1 - 91.4 %   MCV 93.2 78.0 - 100.0 fL   MCH 32.1 26.0 - 34.0 pg   MCHC 34.5 30.0 - 36.0 g/dL   RDW 78.2 95.6 - 21.3 %   Platelets 374 150 - 400 K/uL   Neutrophils Relative % 85 %   Neutro Abs 19.1 (H) 1.7 - 7.7 K/uL   Lymphocytes Relative 7 %   Lymphs Abs 1.6 0.7 - 4.0 K/uL   Monocytes Relative 8 %   Monocytes Absolute 1.8 (H) 0.1 - 1.0 K/uL   Eosinophils Relative 0 %   Eosinophils Absolute 0.0 0.0 - 0.7 K/uL   Basophils Relative 0 %   Basophils Absolute 0.0 0.0 - 0.1 K/uL  Basic metabolic panel     Status: Abnormal   Collection Time: 06/01/18  1:34 AM  Result Value Ref Range   Sodium 137 135 - 145 mmol/L   Potassium 4.3 3.5 - 5.1 mmol/L   Chloride 102 98 - 111 mmol/L   CO2 25 22 - 32 mmol/L   Glucose, Bld 138 (H) 70 - 99 mg/dL   BUN 16 6 - 20 mg/dL   Creatinine, Ser 0.86 (H) 0.61 - 1.24 mg/dL   Calcium 9.8 8.9 - 57.8 mg/dL   GFR calc non Af Amer >60 >60 mL/min   GFR calc Af Amer >60 >60 mL/min   Anion gap 10 5 - 15  Urinalysis, Routine w reflex microscopic     Status: Abnormal   Collection Time: 06/01/18  2:28 AM  Result Value Ref Range   Color, Urine AMBER (A) YELLOW   APPearance CLEAR CLEAR   Specific Gravity, Urine 1.012 1.005 - 1.030   pH 8.0 5.0 - 8.0   Glucose, UA NEGATIVE NEGATIVE mg/dL   Hgb urine dipstick MODERATE (A) NEGATIVE   Bilirubin Urine NEGATIVE NEGATIVE   Ketones, ur NEGATIVE NEGATIVE mg/dL   Protein, ur 469 (A) NEGATIVE mg/dL   Nitrite POSITIVE (A) NEGATIVE   Leukocytes, UA NEGATIVE  NEGATIVE   RBC / HPF >50 (H) 0 - 5 RBC/hpf   WBC, UA 0-5 0 - 5 WBC/hpf   Bacteria, UA RARE (A) NONE SEEN   Squamous Epithelial / LPF 0-5 0 - 5   Mucus PRESENT    Imaging Studies: Ct Renal Stone Study  Result Date: 06/01/2018 CLINICAL DATA:  Abdominal pain. Recent kidney stone removal. Severe pain after removing stent. EXAM: CT ABDOMEN AND PELVIS  WITHOUT CONTRAST TECHNIQUE: Multidetector CT imaging of the abdomen and pelvis was performed following the standard protocol without IV contrast. COMPARISON:  05/14/2018 FINDINGS: Lower chest: Mild dependent changes in the lung bases. Hepatobiliary: No focal liver abnormality is seen. No gallstones, gallbladder wall thickening, or biliary dilatation. Pancreas: Unremarkable. No pancreatic ductal dilatation or surrounding inflammatory changes. Spleen: Normal in size without focal abnormality. Adrenals/Urinary Tract: No adrenal gland nodules. Right hydronephrosis and hydroureter with stranding around the right kidney and ureter. No obstructing stone or focal lesion is identified. Left kidney and ureter are unremarkable. Bladder is decompressed. No bladder wall thickening or stone appreciated. Stomach/Bowel: Stomach, small bowel, and colon are not abnormally distended. Stool fills the colon. Appendix is normal. Vascular/Lymphatic: Aortic atherosclerosis. No enlarged abdominal or pelvic lymph nodes. Reproductive: Prostate is unremarkable. Other: No abdominal wall hernia or abnormality. No abdominopelvic ascites. Musculoskeletal: No acute or significant osseous findings. IMPRESSION: 1. Right hydronephrosis and hydroureter with stranding around the right kidney and ureter. No obstructing stone or lesion is identified. Changes may indicate recently passed stone or pyelonephritis. Bladder is decompressed. No bladder wall thickening or stone appreciated. 2. Diffuse fatty infiltration of the liver. Aortic Atherosclerosis (ICD10-I70.0). Electronically Signed   By: Burman NievesWilliam   Stevens M.D.   On: 06/01/2018 02:00    ED COURSE and MDM  Nursing notes and initial vitals signs, including pulse oximetry, reviewed.  Vitals:   06/01/18 0039 06/01/18 0205  BP: (!) 155/33 (!) 144/84  Pulse: 82 (!) 54  Resp: 20 16  Temp: 97.6 F (36.4 C)   TempSrc: Oral   SpO2: 100% 100%   3:37 AM Pain well controlled.  Discussed with Dr. Marlou PorchHerrick of urology who advises the patient's symptoms are likely due to hydro-resulting from postoperative edema.  He recommends we control his pain and he will follow-up with him in the office.   [place] [place] [facility] [place] substances [place] reveals the patient has received 3 prescriptions for oxycodone and 1 prescription for tramadol in the past 2 years.Marland Kitchen.   PROCEDURES    ED DIAGNOSES     ICD-10-CM   1. Ureteral colic N23   2. Postoperative pain G89.18        Wiatt Mahabir, Jonny RuizJohn, MD 06/01/18 [phone]

## 2019-01-20 IMAGING — CT CT RENAL STONE PROTOCOL
2 of 4 series · 16 of 46 positions shown, 18 images · non-contrast
Comparison: 05/14/2018

CLINICAL DATA: Abdominal pain. Recent kidney stone removal. Severe
pain after removing stent.

EXAM:
CT ABDOMEN AND PELVIS WITHOUT CONTRAST
TECHNIQUE: Multidetector CT imaging of the abdomen and pelvis was performed
following the standard protocol without IV contrast.

[Series 2: axial st · axial · 0.83mm/px · z∈[+974,+1384]mm · 13 of 92 slices shown, 15 images]
[im 5/92  soft-tissue]
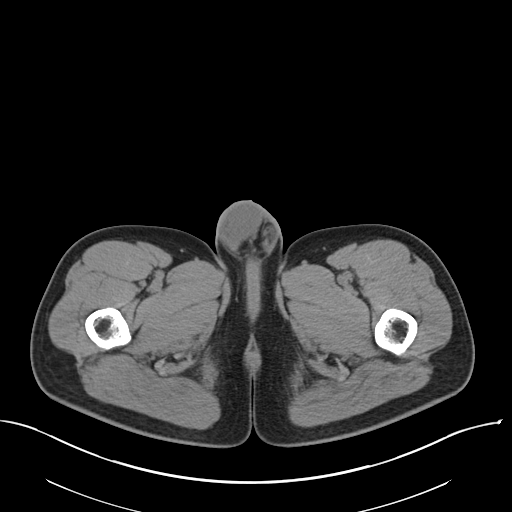
[im 5/92  bone]
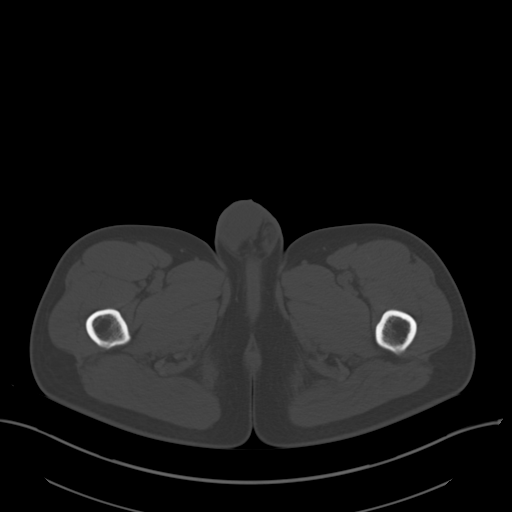
[im 14/92  soft-tissue]
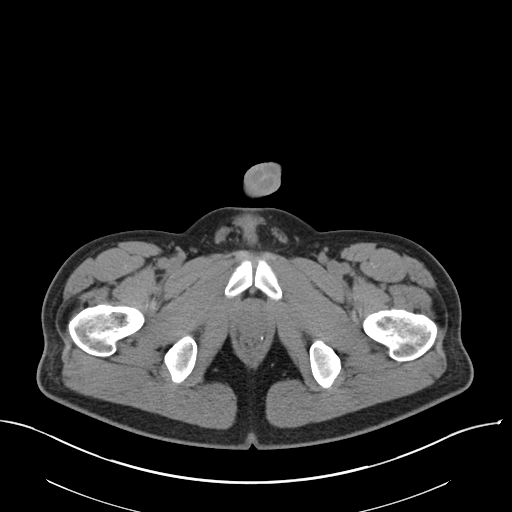
[im 19/92  soft-tissue]
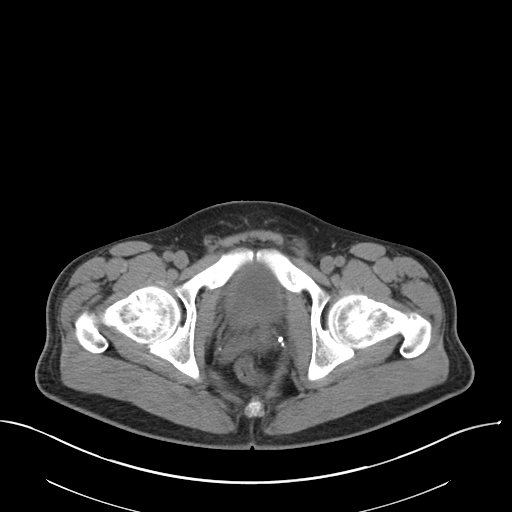
[im 28/92  soft-tissue]
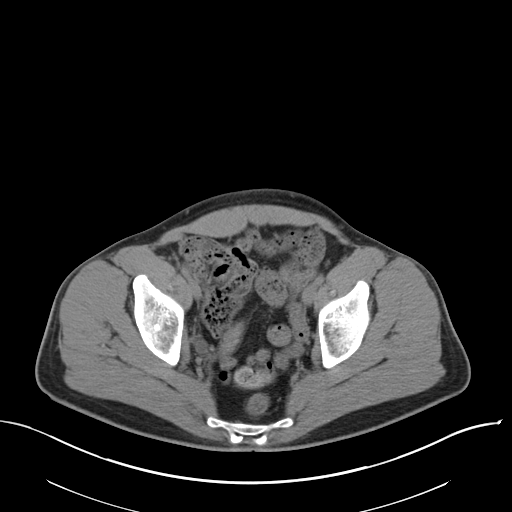
[im 32/92  soft-tissue]
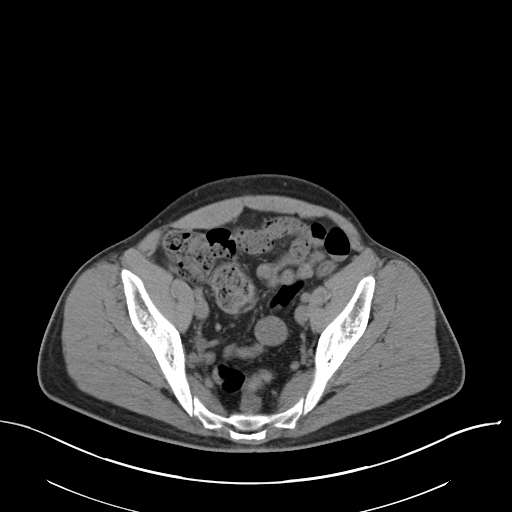
[im 41/92  soft-tissue]
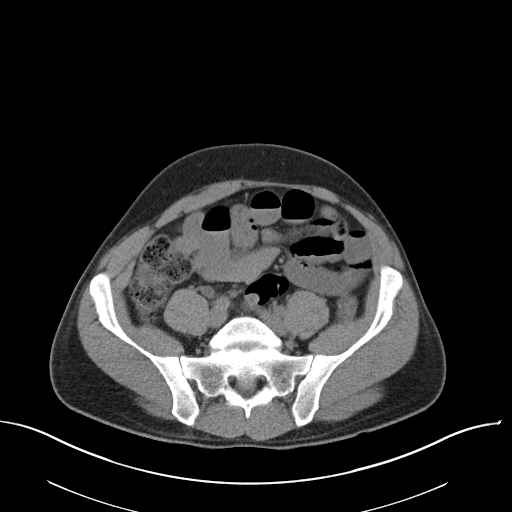
[im 46/92  soft-tissue]
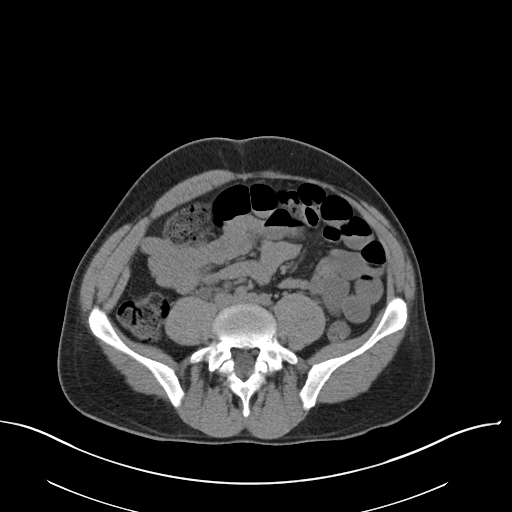
[im 51/92  soft-tissue]
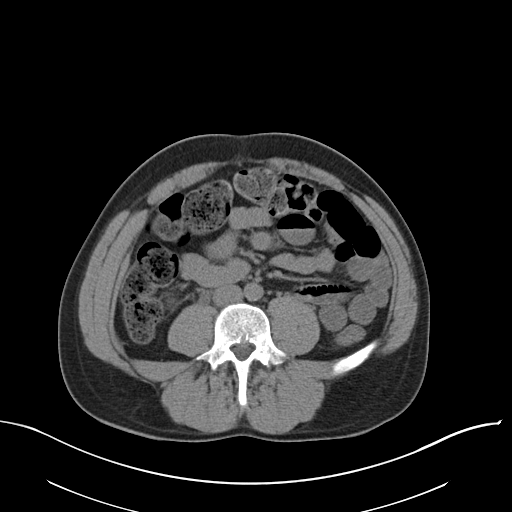
[im 60/92  soft-tissue]
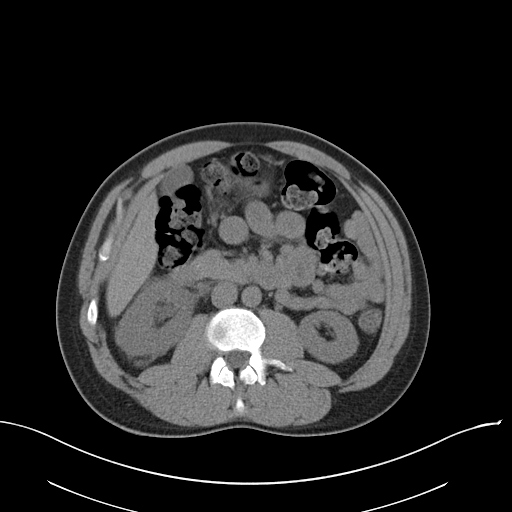
[im 60/92  bone]
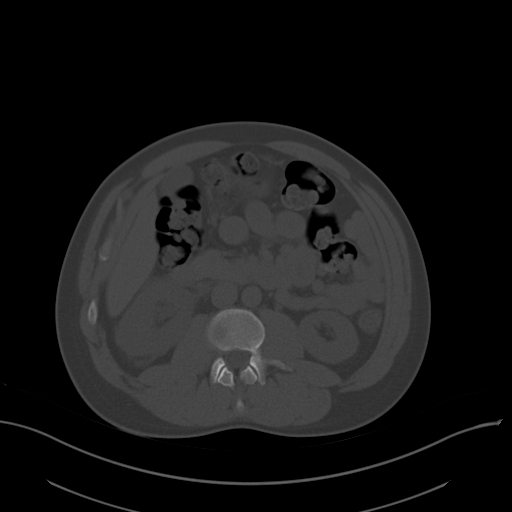
[im 64/92  soft-tissue]
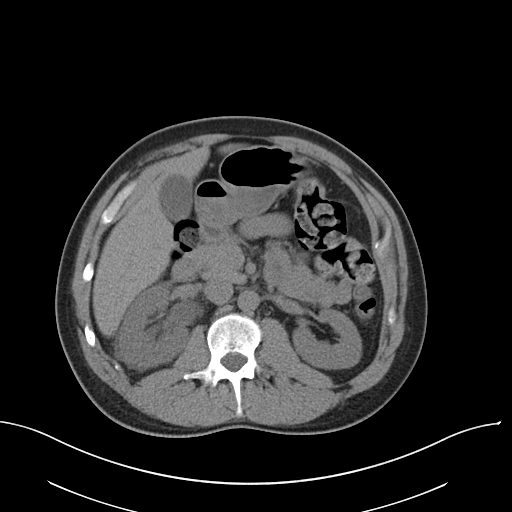
[im 73/92  soft-tissue]
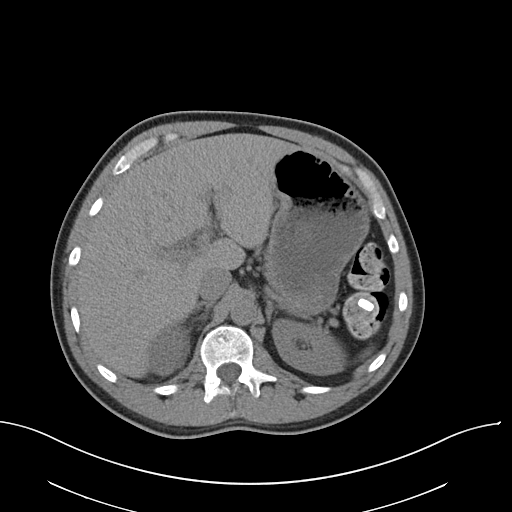
[im 78/92  soft-tissue]
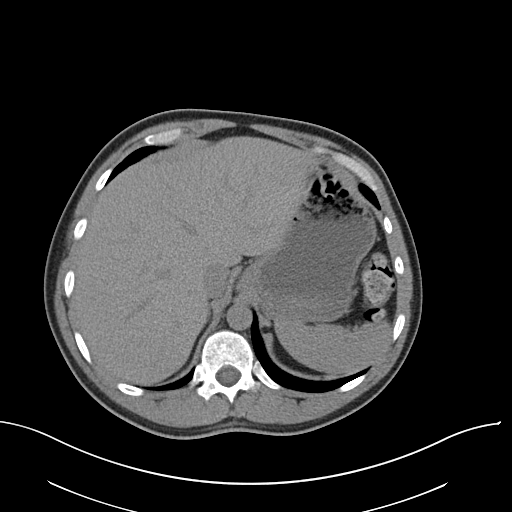
[im 87/92  soft-tissue]
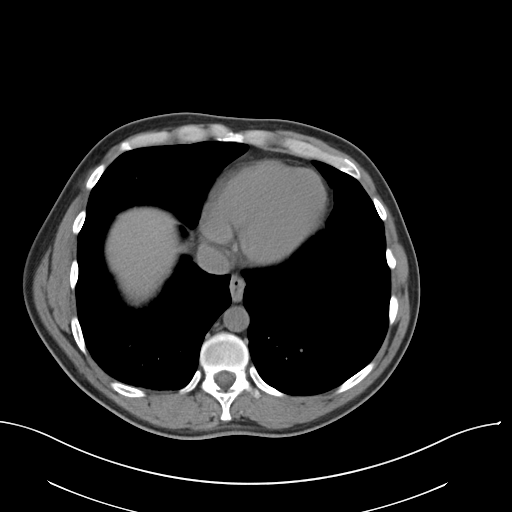

[Series 4: coronal · coronal · 0.77mm/px · 3 of 151 slices shown]
[im 51/151  soft-tissue]
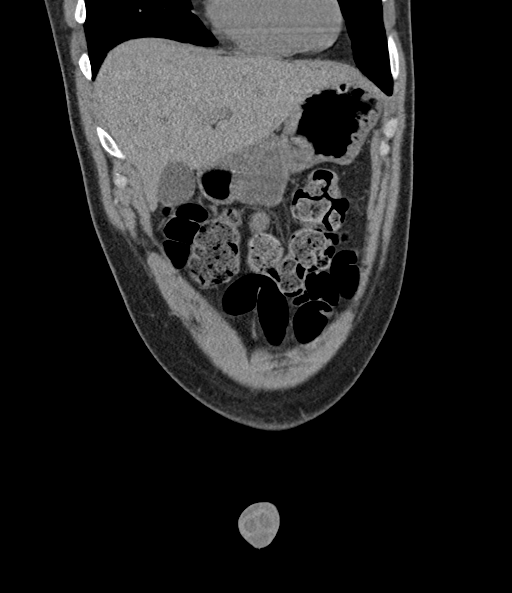
[im 67/151  soft-tissue]
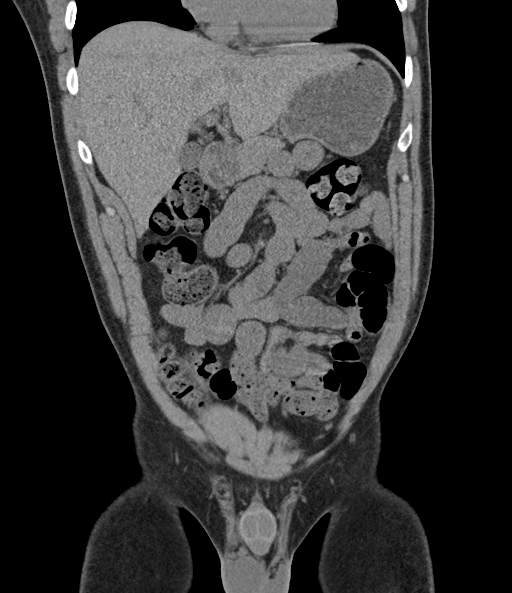
[im 84/151  soft-tissue]
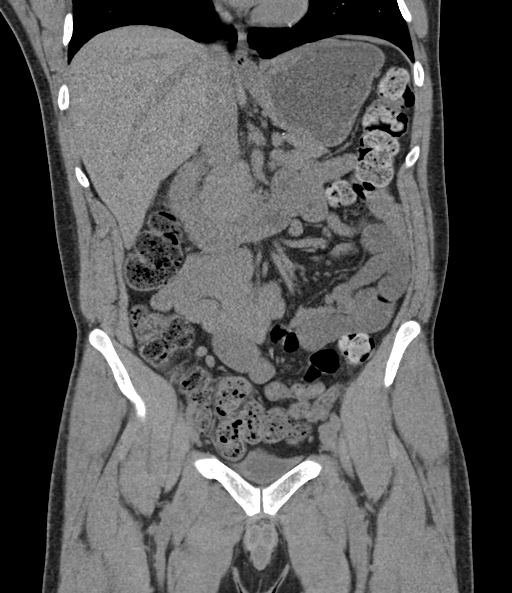

[16 of 46 positions shown; findings below may reference images not displayed]

FINDINGS: Lower chest: Mild dependent changes in the lung bases.

Hepatobiliary: No focal liver abnormality is seen. No gallstones,
gallbladder wall thickening, or biliary dilatation.

Pancreas: Unremarkable. No pancreatic ductal dilatation or
surrounding inflammatory changes.

Spleen: Normal in size without focal abnormality.

Adrenals/Urinary Tract: No adrenal gland nodules. Right
hydronephrosis and hydroureter with stranding around the right
kidney and ureter. No obstructing stone or focal lesion is
identified. Left kidney and ureter are unremarkable. Bladder is
decompressed. No bladder wall thickening or stone appreciated.

Stomach/Bowel: Stomach, small bowel, and colon are not abnormally
distended. Stool fills the colon. Appendix is normal.

Vascular/Lymphatic: Aortic atherosclerosis. No enlarged abdominal or
pelvic lymph nodes.

Reproductive: Prostate is unremarkable.

Other: No abdominal wall hernia or abnormality. No abdominopelvic
ascites.

Musculoskeletal: No acute or significant osseous findings.
IMPRESSION: 1. Right hydronephrosis and hydroureter with stranding around the
right kidney and ureter. No obstructing stone or lesion is
identified. Changes may indicate recently passed stone or
pyelonephritis. Bladder is decompressed. No bladder wall thickening
or stone appreciated.
2. Diffuse fatty infiltration of the liver.

Aortic Atherosclerosis ([id number]-8R4.4).

## 2019-08-08 ENCOUNTER — Other Ambulatory Visit: Payer: Self-pay

## 2019-08-08 DIAGNOSIS — Z20822 Contact with and (suspected) exposure to covid-19: Secondary | ICD-10-CM

## 2019-08-10 LAB — NOVEL CORONAVIRUS, NAA: SARS-CoV-2, NAA: NOT DETECTED

## 2024-04-18 ENCOUNTER — Other Ambulatory Visit (HOSPITAL_COMMUNITY): Payer: Self-pay | Admitting: Gastroenterology

## 2024-04-18 DIAGNOSIS — R1084 Generalized abdominal pain: Secondary | ICD-10-CM

## 2024-04-25 ENCOUNTER — Encounter (HOSPITAL_COMMUNITY): Payer: Self-pay

## 2024-04-25 ENCOUNTER — Ambulatory Visit (HOSPITAL_COMMUNITY)
